# Patient Record
Sex: Male | Born: 1955 | Race: White | Hispanic: No | Marital: Married | Smoking: Never smoker
Health system: Southern US, Community
[De-identification: ages and names within clinical notes are randomized; demographics above are authoritative.]

## PROBLEM LIST (undated history)

## (undated) DIAGNOSIS — K219 Gastro-esophageal reflux disease without esophagitis: Secondary | ICD-10-CM

## (undated) DIAGNOSIS — M1990 Unspecified osteoarthritis, unspecified site: Secondary | ICD-10-CM

## (undated) DIAGNOSIS — C4491 Basal cell carcinoma of skin, unspecified: Secondary | ICD-10-CM

## (undated) DIAGNOSIS — J189 Pneumonia, unspecified organism: Secondary | ICD-10-CM

## (undated) DIAGNOSIS — C801 Malignant (primary) neoplasm, unspecified: Secondary | ICD-10-CM

## (undated) HISTORY — DX: Basal cell carcinoma of skin, unspecified: C44.91

## (undated) HISTORY — PX: COLONOSCOPY: SHX174

## (undated) HISTORY — PX: INGUINAL HERNIA REPAIR: SUR1180

## (undated) HISTORY — PX: HERNIA REPAIR: SHX51

---

## 2010-09-15 LAB — HM COLONOSCOPY

## 2014-11-10 ENCOUNTER — Telehealth: Payer: Self-pay | Admitting: General Practice

## 2014-11-10 NOTE — Telephone Encounter (Signed)
Patient would like to establish care with you.  His wife Timothy Hudson is a patient with Dr. Doug Sou. Please advise

## 2014-11-11 NOTE — Telephone Encounter (Signed)
appt made

## 2014-11-11 NOTE — Telephone Encounter (Signed)
yes

## 2014-11-22 ENCOUNTER — Encounter (HOSPITAL_COMMUNITY): Payer: Self-pay | Admitting: Emergency Medicine

## 2014-11-22 ENCOUNTER — Emergency Department (INDEPENDENT_AMBULATORY_CARE_PROVIDER_SITE_OTHER)
Admission: EM | Admit: 2014-11-22 | Discharge: 2014-11-22 | Disposition: A | Discharge status: Home / Self Care | Payer: PRIVATE HEALTH INSURANCE | Source: Home / Self Care

## 2014-11-22 DIAGNOSIS — L209 Atopic dermatitis, unspecified: Secondary | ICD-10-CM

## 2014-11-22 MED ORDER — METHYLPREDNISOLONE SODIUM SUCC 125 MG IJ SOLR
INTRAMUSCULAR | Status: AC
  Filled 2014-11-22: qty 2

## 2014-11-22 MED ORDER — METHYLPREDNISOLONE SODIUM SUCC 125 MG IJ SOLR
125.0000 mg | Freq: Once | INTRAMUSCULAR | Status: AC
  Administered 2014-11-22: 125 mg via INTRAMUSCULAR

## 2014-11-22 NOTE — ED Notes (Signed)
C/o rash all over body (below neck) onset 7 days Rash becomes blisters Denies fevers, chills Alert... No acute distress.

## 2014-11-22 NOTE — Discharge Instructions (Signed)

## 2014-11-22 NOTE — ED Provider Notes (Signed)
CSN: [id number]     Arrival date & time 11/22/14  1704 History   None    Chief Complaint  Patient presents with  . Rash   (Consider location/radiation/quality/duration/timing/severity/associated sxs/prior Treatment) Patient is a 59 y.o. male presenting with rash. The history is provided by the patient.  Rash Location:  Leg Leg rash location:  L lower leg and R lower leg Quality: blistering, itchiness and redness   Severity:  Moderate Onset quality:  Sudden Duration:  1 week Timing:  Constant Progression:  Spreading Chronicity:  New Context: plant contact   Relieved by:  Anti-itch cream Ineffective treatments:  None tried   History reviewed. No pertinent past medical history. History reviewed. No pertinent past surgical history. No family history on file. Social History  Substance Use Topics  . Smoking status: Never Smoker   . Smokeless tobacco: None  . Alcohol Use: No    Review of Systems  Constitutional: Negative.   HENT: Negative.   Eyes: Negative.   Respiratory: Negative.   Cardiovascular: Negative.   Gastrointestinal: Negative.   Endocrine: Negative.   Genitourinary: Negative.   Skin: Positive for rash.    Allergies  Review of patient's allergies indicates no known allergies.  Home Medications   Prior to Admission medications   Not on File   Meds Ordered and Administered this Visit  Medications - No data to display  BP 121/74 mmHg  Pulse 57  Temp(Src) 97.8 F (36.6 C) (Oral)  Resp 14  SpO2 97% No data found.   Physical Exam  Constitutional: He appears well-developed and well-nourished.  HENT:  Head: Normocephalic and atraumatic.  Eyes: Conjunctivae and EOM are normal. Pupils are equal, round, and reactive to light.  Neck: Normal range of motion.  Cardiovascular: Normal rate, regular rhythm and normal heart sounds.   Pulmonary/Chest: Effort normal and breath sounds normal.  Abdominal: Soft. Bowel sounds are normal.  Skin: Rash noted. Rash  is maculopapular.     Erythematous macular papular rash on bilateral legs.    ED Course  Procedures (including critical care time)  Labs Review Labs Reviewed - No data to display          MDM  Advised patient to take benadryl otc as directed.  Recommend follow up with PCP or  With Dermatology PRN.  He may follow up here prn.    Lysbeth Penner, FNP 11/22/14 [place], [place] 11/22/14 1758

## 2014-11-23 ENCOUNTER — Ambulatory Visit: Payer: PRIVATE HEALTH INSURANCE | Admitting: Internal Medicine

## 2014-11-25 ENCOUNTER — Other Ambulatory Visit (INDEPENDENT_AMBULATORY_CARE_PROVIDER_SITE_OTHER): Payer: PRIVATE HEALTH INSURANCE

## 2014-11-25 ENCOUNTER — Encounter: Payer: Self-pay | Admitting: Internal Medicine

## 2014-11-25 ENCOUNTER — Ambulatory Visit (INDEPENDENT_AMBULATORY_CARE_PROVIDER_SITE_OTHER): Payer: PRIVATE HEALTH INSURANCE | Admitting: Internal Medicine

## 2014-11-25 VITALS — BP 120/86 | HR 60 | Temp 97.8°F | Resp 16 | Ht 66.0 in | Wt 152.0 lb

## 2014-11-25 DIAGNOSIS — Z0000 Encounter for general adult medical examination without abnormal findings: Secondary | ICD-10-CM | POA: Insufficient documentation

## 2014-11-25 DIAGNOSIS — Z0001 Encounter for general adult medical examination with abnormal findings: Secondary | ICD-10-CM | POA: Insufficient documentation

## 2014-11-25 DIAGNOSIS — Z86010 Personal history of colonic polyps: Secondary | ICD-10-CM | POA: Insufficient documentation

## 2014-11-25 LAB — CBC WITH DIFFERENTIAL/PLATELET
Basophils Absolute: 0 10*3/uL (ref 0.0–0.1)
Basophils Relative: 0.1 % (ref 0.0–3.0)
EOS ABS: 0 10*3/uL (ref 0.0–0.7)
Eosinophils Relative: 0 % (ref 0.0–5.0)
HEMATOCRIT: 44.3 % (ref 39.0–52.0)
HEMOGLOBIN: 15.2 g/dL (ref 13.0–17.0)
LYMPHS PCT: 10 % — AB (ref 12.0–46.0)
Lymphs Abs: 0.7 10*3/uL (ref 0.7–4.0)
MCHC: 34.3 g/dL (ref 30.0–36.0)
MCV: 97.7 fl (ref 78.0–100.0)
MONO ABS: 0.2 10*3/uL (ref 0.1–1.0)
Monocytes Relative: 2.2 % — ABNORMAL LOW (ref 3.0–12.0)
Neutro Abs: 6.6 10*3/uL (ref 1.4–7.7)
Neutrophils Relative %: 87.7 % — ABNORMAL HIGH (ref 43.0–77.0)
Platelets: 278 10*3/uL (ref 150.0–400.0)
RBC: 4.54 Mil/uL (ref 4.22–5.81)
RDW: 13.4 % (ref 11.5–15.5)
WBC: 7.5 10*3/uL (ref 4.0–10.5)

## 2014-11-25 LAB — COMPREHENSIVE METABOLIC PANEL
ALBUMIN: 4.3 g/dL (ref 3.5–5.2)
ALK PHOS: 46 U/L (ref 39–117)
ALT: 15 U/L (ref 0–53)
AST: 17 U/L (ref 0–37)
BUN: 22 mg/dL (ref 6–23)
CALCIUM: 9.3 mg/dL (ref 8.4–10.5)
CHLORIDE: 101 meq/L (ref 96–112)
CO2: 30 mEq/L (ref 19–32)
CREATININE: 1.08 mg/dL (ref 0.40–1.50)
GFR: 74.18 mL/min (ref >60.00)
Glucose, Bld: 105 mg/dL — ABNORMAL HIGH (ref 70–99)
POTASSIUM: 4.6 meq/L (ref 3.5–5.1)
SODIUM: 137 meq/L (ref 135–145)
TOTAL PROTEIN: 6.9 g/dL (ref 6.0–8.3)
Total Bilirubin: 0.5 mg/dL (ref 0.2–1.2)

## 2014-11-25 LAB — LIPID PANEL
CHOLESTEROL: 199 mg/dL (ref 0–200)
HDL: 58.7 mg/dL (ref >39.00)
LDL CALC: 116 mg/dL — AB (ref 0–99)
NonHDL: 140.46
TRIGLYCERIDES: 122 mg/dL (ref 0.0–149.0)
Total CHOL/HDL Ratio: 3
VLDL: 24.4 mg/dL (ref 0.0–40.0)

## 2014-11-25 LAB — FECAL OCCULT BLOOD, GUAIAC: FECAL OCCULT BLD: NEGATIVE

## 2014-11-25 LAB — PSA: PSA: 0.42 ng/mL (ref 0.10–4.00)

## 2014-11-25 LAB — TSH: TSH: 0.37 u[IU]/mL (ref 0.35–4.50)

## 2014-11-25 NOTE — Patient Instructions (Signed)

## 2014-11-25 NOTE — Progress Notes (Signed)
Pre visit review using our clinic review tool, if applicable. No additional management support is needed unless otherwise documented below in the visit note. 

## 2014-11-26 ENCOUNTER — Encounter: Payer: Self-pay | Admitting: Internal Medicine

## 2014-11-26 LAB — HEPATITIS C ANTIBODY: HCV AB: NEGATIVE

## 2014-11-26 NOTE — Progress Notes (Signed)
Subjective:  Patient ID: Timothy Hudson, male    DOB: 1955-07-29  Age: 59 y.o. MRN: [id number]  CC: Annual Exam   HPI Timothy Hudson presents for a complete physical. He is new to me. He offers no complaints today. He sees a Paediatric nurse in [place] regularly. He needs a follow-up colonoscopy regarding a history of polyps.  History Timothy Hudson has no past medical history on file.   He has past surgical history that includes Hernia repair [phone]).   His family history includes Alcohol abuse in his father and mother; COPD in his mother; Cancer in his maternal grandmother; Early death (age of onset: 3) in his brother; Heart disease in his father. There is no history of Stroke, Asthma, Diabetes, Drug abuse, Hyperlipidemia, Hypertension, or Kidney disease.He reports that he has never smoked. He has never used smokeless tobacco. He reports that he does not drink alcohol or use illicit drugs.  No outpatient prescriptions prior to visit.   No facility-administered medications prior to visit.    ROS Review of Systems  Constitutional: Negative.   HENT: Negative.   Eyes: Negative.   Respiratory: Negative.  Negative for cough, choking, chest tightness, shortness of breath and stridor.   Cardiovascular: Negative.  Negative for chest pain, palpitations and leg swelling.  Gastrointestinal: Negative.  Negative for nausea, vomiting, abdominal pain, diarrhea, constipation and blood in stool.  Endocrine: Negative.   Genitourinary: Negative.  Negative for difficulty urinating.  Musculoskeletal: Negative.   Skin: Negative.  Negative for rash.  Allergic/Immunologic: Negative.   Neurological: Negative.  Negative for dizziness, tremors and light-headedness.  Hematological: Negative.   Psychiatric/Behavioral: Negative.     Objective:  BP 120/86 mmHg  Pulse 60  Temp(Src) 97.8 F (36.6 C) (Oral)  Resp 16  Ht 5\' 6"  (1.676 m)  Wt 152 lb (68.947 kg)  BMI 24.55 kg/m2  SpO2 97%  Physical Exam    Constitutional: He appears well-developed and well-nourished. No distress.  HENT:  Head: Normocephalic and atraumatic.  Mouth/Throat: Oropharynx is clear and moist. No oropharyngeal exudate.  Eyes: Conjunctivae are normal. Right eye exhibits no discharge. Left eye exhibits no discharge. No scleral icterus.  Neck: Normal range of motion. Neck supple. No JVD present. No tracheal deviation present. No thyromegaly present.  Cardiovascular: Normal rate, regular rhythm, normal heart sounds and intact distal pulses.  Exam reveals no gallop and no friction rub.   No murmur heard. Pulmonary/Chest: Effort normal and breath sounds normal. No stridor. No respiratory distress. He has no wheezes. He has no rales. He exhibits no tenderness.  Abdominal: Soft. Bowel sounds are normal. He exhibits no distension and no mass. There is no tenderness. There is no rebound and no guarding. Hernia confirmed negative in the right inguinal area and confirmed negative in the left inguinal area.  Genitourinary: Rectum normal, prostate normal, testes normal and penis normal. Rectal exam shows no external hemorrhoid, no internal hemorrhoid, no fissure, no mass, no tenderness and anal tone normal. Guaiac negative stool. Prostate is not enlarged and not tender. Right testis shows no mass, no swelling and no tenderness. Right testis is descended. Left testis shows no mass, no swelling and no tenderness. Left testis is descended. Circumcised. No penile erythema or penile tenderness. No discharge found.  Lymphadenopathy:    He has no cervical adenopathy.       Right: No inguinal adenopathy present.       Left: No inguinal adenopathy present.  Skin: He is not diaphoretic.  Vitals reviewed.  Assessment & Plan:   Timothy Hudson was seen today for annual exam.  Diagnoses and all orders for this visit:  Routine general medical examination at a health care facility- his physical exam is been completed and is unremarkable. His labs  show a slight increase in his LDL and blood sugar but these do not require intervention at this time. His vaccines were reviewed and updated. He was referred for colonoscopy. He was given patient education material. -     Lipid panel; Future -     Comprehensive metabolic panel; Future -     CBC with Differential/Platelet; Future -     PSA; Future -     TSH; Future -     Hepatitis C antibody; Future  History of colonic polyps -     Ambulatory referral to Gastroenterology   I am having Timothy Hudson maintain his famotidine.  Meds ordered this encounter  Medications  . famotidine (PEPCID) 20 MG tablet    Sig: Take 20 mg by mouth 2 (two) times daily.     Follow-up: Return in about 1 year (around 11/25/2015).  Scarlette Calico, MD

## 2015-02-05 ENCOUNTER — Telehealth: Payer: Self-pay | Admitting: Internal Medicine

## 2015-02-05 NOTE — Telephone Encounter (Signed)
Rec'd from [facility] forward 20 pages to Dr. Ronnald Ramp

## 2015-04-15 ENCOUNTER — Telehealth: Payer: Self-pay | Admitting: Internal Medicine

## 2015-04-15 ENCOUNTER — Telehealth: Payer: Self-pay

## 2015-04-15 NOTE — Telephone Encounter (Signed)
Rec'd from [facility] forward 20 pages to GI Historical Provider

## 2015-04-15 NOTE — Telephone Encounter (Signed)
Received records from [facility] and placed on Dr. Blanch Media desk for review. Dr. Henrene Pastor is Doc of the Day.

## 2015-04-30 ENCOUNTER — Encounter: Payer: Self-pay | Admitting: Internal Medicine

## 2015-06-21 ENCOUNTER — Ambulatory Visit (AMBULATORY_SURGERY_CENTER): Payer: Self-pay | Admitting: *Deleted

## 2015-06-21 VITALS — Ht 65.5 in | Wt 155.0 lb

## 2015-06-21 DIAGNOSIS — Z86010 Personal history of colonic polyps: Secondary | ICD-10-CM

## 2015-06-21 MED ORDER — NA SULFATE-K SULFATE-MG SULF 17.5-3.13-1.6 GM/177ML PO SOLN: ORAL | Status: DC

## 2015-06-21 NOTE — Progress Notes (Signed)
Patient denies any allergies to eggs or soy. Patient denies any problems with anesthesia/sedation. Patient cannot take Versed, makes him hyper. Patient denies any oxygen use at home and does not take any diet/weight loss medications. Patient declined EMMI education.

## 2015-07-05 ENCOUNTER — Ambulatory Visit (AMBULATORY_SURGERY_CENTER): Payer: PRIVATE HEALTH INSURANCE | Admitting: Internal Medicine

## 2015-07-05 ENCOUNTER — Encounter: Payer: Self-pay | Admitting: Internal Medicine

## 2015-07-05 VITALS — BP 107/68 | HR 56 | Temp 97.3°F | Resp 13 | Ht 65.0 in | Wt 155.0 lb

## 2015-07-05 DIAGNOSIS — D125 Benign neoplasm of sigmoid colon: Secondary | ICD-10-CM

## 2015-07-05 DIAGNOSIS — Z86010 Personal history of colonic polyps: Secondary | ICD-10-CM | POA: Diagnosis present

## 2015-07-05 LAB — HM COLONOSCOPY

## 2015-07-05 MED ORDER — SODIUM CHLORIDE 0.9 % IV SOLN: 500.0000 mL | INTRAVENOUS | Status: DC

## 2015-07-05 NOTE — Progress Notes (Signed)
To recovery, report to [place], RN, VSS 

## 2015-07-05 NOTE — Progress Notes (Signed)
Called to room to assist during endoscopic procedure.  Patient ID and intended procedure confirmed with present staff. Received instructions for my participation in the procedure from the performing physician.  

## 2015-07-05 NOTE — Patient Instructions (Signed)

## 2015-07-05 NOTE — Op Note (Signed)
[facility] Patient Name: Timothy Hudson Procedure Date: 07/05/2015 8:32 AM MRN: [id number] Endoscopist: Docia Chuck. Henrene Pastor , MD Age: 60 Date of Birth: Feb 04, 1956 Gender: Male Procedure:                Colonoscopy with cold snare polypectomy -1 Indications:              High risk colon cancer surveillance: Personal                            history of colonic polyps. Previous examinations                            elsewhere 2007 (tubular adenomas) and 2011                            (diverticulosis) Medicines:                Monitored Anesthesia Care Procedure:                Pre-Anesthesia Assessment:                           - Prior to the procedure, a History and Physical                            was performed, and patient medications and                            allergies were reviewed. The patient's tolerance of                            previous anesthesia was also reviewed. The risks                            and benefits of the procedure and the sedation                            options and risks were discussed with the patient.                            All questions were answered, and informed consent                            was obtained. Prior Anticoagulants: The patient has                            taken no previous anticoagulant or antiplatelet                            agents. ASA Grade Assessment: II - A patient with                            mild systemic disease. After reviewing the risks  and benefits, the patient was deemed in                            satisfactory condition to undergo the procedure.                           After obtaining informed consent, the colonoscope                            was passed under direct vision. Throughout the                            procedure, the patient's blood pressure, pulse, and                            oxygen saturations were monitored continuously. The                Model CF-HQ190L [id number]) scope was introduced                            through the anus and advanced to the the cecum,                            identified by appendiceal orifice and ileocecal                            valve. The colonoscopy was performed without                            difficulty. The patient tolerated the procedure                            well. The quality of the bowel preparation was                            excellent. The bowel preparation used was SUPREP.                            The ileocecal valve, appendiceal orifice, and                            rectum were photographed. Scope In: 8:45:01 AM Scope Out: 8:56:15 AM Scope Withdrawal Time: 0 hours 8 minutes 26 seconds  Total Procedure Duration: 0 hours 11 minutes 14 seconds  Findings:                 The digital rectal exam was normal.                           A 5 mm polyp was found in the sigmoid colon. The                            polyp was semi-pedunculated. The polyp was removed  with a cold snare. Resection and retrieval were                            complete.                           Multiple diverticula were found in the left colon.                           Internal hemorrhoids were found during retroflexion.                           The exam was otherwise without abnormality on                            direct and retroflexion views. Complications:            No immediate complications. Estimated Blood Loss:     Estimated blood loss: none. Impression:               - One 5 mm polyp in the sigmoid colon, removed with                            a cold snare. Resected and retrieved.                           - Diverticulosis in the left colon.                           - Internal hemorrhoids.                           - The examination was otherwise normal on direct                            and retroflexion views. Recommendation:           -  Patient has a contact number available for                            emergencies. The signs and symptoms of potential                            delayed complications were discussed with the                            patient. Return to normal activities tomorrow.                            Written discharge instructions were provided to the                            patient.                           - Resume previous diet.                           -  Continue present medications.                           - Await pathology results.                           - Repeat colonoscopy in 5 years for surveillance. Docia Chuck. Henrene Pastor, MD 07/05/2015 9:03:08 AM This report has been signed electronically. CC Letter to:             Janith Lima, MD

## 2015-07-05 NOTE — Addendum Note (Signed)
Addended by: Janith Lima on: 07/05/2015 09:45 AM   Modules accepted: Miquel Dunn

## 2015-07-06 ENCOUNTER — Telehealth: Payer: Self-pay | Admitting: *Deleted

## 2015-07-06 NOTE — Telephone Encounter (Signed)
  Follow up Call-  Call back number 07/05/2015  Post procedure Call Back phone  # [phone]  Permission to leave phone message Yes     Patient questions:  Do you have a fever, pain , or abdominal swelling? No. Pain Score  0 *  Have you tolerated food without any problems? Yes.    Have you been able to return to your normal activities? Yes.    Do you have any questions about your discharge instructions: Diet   No. Medications  No. Follow up visit  No.  Do you have questions or concerns about your Care? No.  Actions: * If pain score is 4 or above: No action needed, pain <4.

## 2015-07-13 ENCOUNTER — Encounter: Payer: Self-pay | Admitting: Internal Medicine

## 2015-07-14 ENCOUNTER — Encounter: Payer: Self-pay | Admitting: Internal Medicine

## 2015-07-14 LAB — HM COLONOSCOPY

## 2015-07-14 NOTE — Addendum Note (Signed)
Addended by: Janith Lima on: 07/14/2015 07:37 AM   Modules accepted: Miquel Dunn

## 2015-08-03 NOTE — Telephone Encounter (Signed)
Patient has been seen by the doctor

## 2017-02-27 ENCOUNTER — Other Ambulatory Visit (INDEPENDENT_AMBULATORY_CARE_PROVIDER_SITE_OTHER): Payer: PRIVATE HEALTH INSURANCE

## 2017-02-27 ENCOUNTER — Encounter: Payer: Self-pay | Admitting: Internal Medicine

## 2017-02-27 ENCOUNTER — Ambulatory Visit (INDEPENDENT_AMBULATORY_CARE_PROVIDER_SITE_OTHER): Payer: PRIVATE HEALTH INSURANCE | Admitting: Internal Medicine

## 2017-02-27 VITALS — BP 112/70 | HR 60 | Temp 97.9°F | Resp 16 | Ht 65.0 in | Wt 154.2 lb

## 2017-02-27 DIAGNOSIS — Z23 Encounter for immunization: Secondary | ICD-10-CM | POA: Insufficient documentation

## 2017-02-27 DIAGNOSIS — Z0000 Encounter for general adult medical examination without abnormal findings: Secondary | ICD-10-CM | POA: Diagnosis not present

## 2017-02-27 DIAGNOSIS — R202 Paresthesia of skin: Secondary | ICD-10-CM

## 2017-02-27 DIAGNOSIS — G5601 Carpal tunnel syndrome, right upper limb: Secondary | ICD-10-CM | POA: Insufficient documentation

## 2017-02-27 DIAGNOSIS — R200 Anesthesia of skin: Secondary | ICD-10-CM

## 2017-02-27 LAB — COMPREHENSIVE METABOLIC PANEL
ALK PHOS: 41 U/L (ref 39–117)
ALT: 15 U/L (ref 0–53)
AST: 17 U/L (ref 0–37)
Albumin: 4.5 g/dL (ref 3.5–5.2)
BUN: 15 mg/dL (ref 6–23)
CALCIUM: 9.3 mg/dL (ref 8.4–10.5)
CO2: 30 mEq/L (ref 19–32)
Chloride: 102 mEq/L (ref 96–112)
Creatinine, Ser: 1.03 mg/dL (ref 0.40–1.50)
GFR: 77.77 mL/min (ref >60.00)
GLUCOSE: 87 mg/dL (ref 70–99)
POTASSIUM: 4.7 meq/L (ref 3.5–5.1)
Sodium: 138 mEq/L (ref 135–145)
TOTAL PROTEIN: 6.7 g/dL (ref 6.0–8.3)
Total Bilirubin: 0.9 mg/dL (ref 0.2–1.2)

## 2017-02-27 LAB — CBC WITH DIFFERENTIAL/PLATELET
Basophils Absolute: 0.1 10*3/uL (ref 0.0–0.1)
Basophils Relative: 1 % (ref 0.0–3.0)
EOS PCT: 1.9 % (ref 0.0–5.0)
Eosinophils Absolute: 0.1 10*3/uL (ref 0.0–0.7)
HEMATOCRIT: 45 % (ref 39.0–52.0)
Hemoglobin: 15.3 g/dL (ref 13.0–17.0)
LYMPHS ABS: 1.5 10*3/uL (ref 0.7–4.0)
Lymphocytes Relative: 24.9 % (ref 12.0–46.0)
MCHC: 34.1 g/dL (ref 30.0–36.0)
MCV: 97.3 fl (ref 78.0–100.0)
MONOS PCT: 8.2 % (ref 3.0–12.0)
Monocytes Absolute: 0.5 10*3/uL (ref 0.1–1.0)
NEUTROS ABS: 4 10*3/uL (ref 1.4–7.7)
NEUTROS PCT: 64 % (ref 43.0–77.0)
Platelets: 265 10*3/uL (ref 150.0–400.0)
RBC: 4.62 Mil/uL (ref 4.22–5.81)
RDW: 12.3 % (ref 11.5–15.5)
WBC: 6.2 10*3/uL (ref 4.0–10.5)

## 2017-02-27 LAB — LIPID PANEL
Cholesterol: 173 mg/dL (ref 0–200)
HDL: 54.8 mg/dL (ref >39.00)
LDL Cholesterol: 105 mg/dL — ABNORMAL HIGH (ref 0–99)
NONHDL: 118.51
TRIGLYCERIDES: 67 mg/dL (ref 0.0–149.0)
Total CHOL/HDL Ratio: 3
VLDL: 13.4 mg/dL (ref 0.0–40.0)

## 2017-02-27 LAB — PSA: PSA: 0.63 ng/mL (ref 0.10–4.00)

## 2017-02-27 MED ORDER — ZOSTER VAC RECOMB ADJUVANTED 50 MCG/0.5ML IM SUSR: 0.5000 mL | Freq: Once | INTRAMUSCULAR | 1 refills | Status: AC

## 2017-02-27 NOTE — Progress Notes (Signed)
Subjective:  Patient ID: Timothy Hudson, male    DOB: May 27, 1955  Age: 61 y.o. MRN: [id number]  CC: Annual Exam   HPI Timothy Hudson presents for a CPX.  He complains of a 81-month history of intermittent numbness in his right upper extremity.  He has also had a few episodes of neck soreness.  He denies any history of trauma or injury to his neck or upper extremity.  He notices the numbness at night while he sleeping more so than during the day.  He does not experience numbness or tingling anywhere else in his body.  He is very active and has had no recent episodes of DOE, CP, S OB, palpitations, edema, or fatigue.  Outpatient Medications Prior to Visit  Medication Sig Dispense Refill  . famotidine (PEPCID) 20 MG tablet Take 20 mg by mouth 2 (two) times daily.    . Multiple Vitamins-Minerals (CENTRUM PO) Take 1 tablet by mouth daily.     No facility-administered medications prior to visit.     ROS Review of Systems  Constitutional: Negative.  Negative for appetite change, diaphoresis, fatigue and unexpected weight change.  HENT: Negative.   Eyes: Negative.  Negative for visual disturbance.  Respiratory: Negative.  Negative for cough and chest tightness.   Cardiovascular: Negative.  Negative for chest pain, palpitations and leg swelling.  Gastrointestinal: Negative.  Negative for constipation, diarrhea, nausea and vomiting.  Endocrine: Negative.   Genitourinary: Negative for difficulty urinating, dysuria, frequency, hematuria, scrotal swelling, testicular pain and urgency.  Musculoskeletal: Positive for neck pain. Negative for back pain and myalgias.  Skin: Negative.  Negative for rash.  Allergic/Immunologic: Negative.   Neurological: Positive for numbness. Negative for dizziness, weakness, light-headedness and headaches.  Hematological: Negative for adenopathy. Does not bruise/bleed easily.  Psychiatric/Behavioral: Negative.     Objective:  BP 112/70 (BP Location: Left Arm,  Patient Position: Sitting, Cuff Size: Normal)   Pulse 60   Temp 97.9 F (36.6 C) (Oral)   Resp 16   Ht 5\' 5"  (1.651 m)   Wt 154 lb 4 oz (70 kg)   SpO2 98%   BMI 25.67 kg/m   BP Readings from Last 3 Encounters:  02/27/17 112/70  07/05/15 107/68  11/25/14 120/86    Wt Readings from Last 3 Encounters:  02/27/17 154 lb 4 oz (70 kg)  07/05/15 155 lb (70.3 kg)  06/21/15 155 lb (70.3 kg)    Physical Exam  Constitutional: He is oriented to person, place, and time. No distress.  HENT:  Mouth/Throat: Oropharynx is clear and moist. No oropharyngeal exudate.  Eyes: Conjunctivae are normal. Right eye exhibits no discharge. Left eye exhibits no discharge. No scleral icterus.  Neck: Normal range of motion. Neck supple. No JVD present. No thyromegaly present.  Cardiovascular: Normal rate and regular rhythm. Exam reveals no gallop.  No murmur heard. Pulmonary/Chest: Effort normal and breath sounds normal. No respiratory distress. He has no wheezes. He has no rales.  Abdominal: Soft. Bowel sounds are normal. He exhibits no distension and no mass. There is no rebound and no guarding.  Musculoskeletal: Normal range of motion. He exhibits no edema or tenderness.       Cervical back: Normal. He exhibits normal range of motion, no tenderness, no bony tenderness, no swelling, no edema and no deformity.  Lymphadenopathy:    He has no cervical adenopathy.  Neurological: He is alert and oriented to person, place, and time. He has normal strength and normal reflexes. He displays no atrophy,  no tremor and normal reflexes. No cranial nerve deficit or sensory deficit. He exhibits normal muscle tone. He displays a negative Romberg sign. He displays no seizure activity. Coordination and gait normal.  Skin: Skin is warm and dry. No rash noted. He is not diaphoretic. No erythema. No pallor.  Psychiatric: He has a normal mood and affect. His behavior is normal. Judgment and thought content normal.  Vitals  reviewed.   Lab Results  Component Value Date   WBC 6.2 02/27/2017   HGB 15.3 02/27/2017   HCT 45.0 02/27/2017   PLT 265.0 02/27/2017   GLUCOSE 87 02/27/2017   CHOL 173 02/27/2017   TRIG 67.0 02/27/2017   HDL 54.80 02/27/2017   LDLCALC 105 (H) 02/27/2017   ALT 15 02/27/2017   AST 17 02/27/2017   NA 138 02/27/2017   K 4.7 02/27/2017   CL 102 02/27/2017   CREATININE 1.03 02/27/2017   BUN 15 02/27/2017   CO2 30 02/27/2017   TSH 0.37 11/25/2014   PSA 0.63 02/27/2017    No results found.  Assessment & Plan:   Marland was seen today for annual exam.  Diagnoses and all orders for this visit:  Routine general medical examination at a health care facility- Exam completed, labs ordered and reviewed, vaccines reviewed and updated, screening for colon cancer is up-to-date, patient education material was given. -     Comprehensive metabolic panel; Future -     CBC with Differential/Platelet; Future -     Lipid panel; Future -     PSA; Future -     HIV antibody; Future  Need for shingles vaccine -     Zoster Vaccine Adjuvanted [facility]) injection; Inject 0.5 mLs into the muscle once for 1 dose.  Right arm numbness- I have asked him to undergo an NCS/EMG to try to identify the cause of the paresthesias in his right upper extremity. -     Ambulatory referral to Neurology   I am having [name] start on Zoster Vaccine Adjuvanted. I am also having him maintain his famotidine and Multiple Vitamins-Minerals (CENTRUM PO).  Meds ordered this encounter  Medications  . Zoster Vaccine Adjuvanted [facility]) injection    Sig: Inject 0.5 mLs into the muscle once for 1 dose.    Dispense:  0.5 mL    Refill:  1     Follow-up: Return if symptoms worsen or fail to improve.  Scarlette Calico, MD

## 2017-02-27 NOTE — Patient Instructions (Signed)

## 2017-02-28 ENCOUNTER — Encounter: Payer: Self-pay | Admitting: Internal Medicine

## 2017-02-28 LAB — HIV ANTIBODY (ROUTINE TESTING W REFLEX): HIV 1&2 Ab, 4th Generation: NONREACTIVE

## 2017-03-24 ENCOUNTER — Ambulatory Visit: Payer: PRIVATE HEALTH INSURANCE | Admitting: Nurse Practitioner

## 2017-03-24 VITALS — BP 122/74 | HR 69 | Temp 98.0°F | Ht 65.0 in | Wt 157.0 lb

## 2017-03-24 DIAGNOSIS — S40861A Insect bite (nonvenomous) of right upper arm, initial encounter: Secondary | ICD-10-CM | POA: Diagnosis not present

## 2017-03-24 DIAGNOSIS — W57XXXA Bitten or stung by nonvenomous insect and other nonvenomous arthropods, initial encounter: Secondary | ICD-10-CM

## 2017-03-24 MED ORDER — DOXYCYCLINE HYCLATE 100 MG PO TABS: 100.0000 mg | ORAL_TABLET | Freq: Two times a day (BID) | ORAL | 0 refills | Status: DC

## 2017-03-24 NOTE — Patient Instructions (Addendum)
I have sent a prescription for doxycycline 100 mg twice daily for 10 days.  Please follow up for fevers, body aches, a bullseye shaped rash on your body, or if you notice redness spreading from the blister.  For the itching, you may try benadryl or cortisone cream.  It was nice to meet you. Thanks for letting me take care of you today :)   Tick Bite Information, Adult Ticks are insects that can bite. Most ticks live in shrubs and grassy areas. They climb onto people and animals that go by. Then they bite. Some ticks carry germs that can make you sick. How can I prevent tick bites?  Use an insect repellent that has 20% or higher of the ingredients DEET, picaridin, or IR3535. Put this insect repellent on: ? Bare skin. ? The tops of your boots. ? Your pant legs. ? The ends of your sleeves.  If you use an insect repellent that has the ingredient permethrin, make sure to follow the instructions on the bottle. Treat the following: ? Clothing. ? Supplies. ? Boots. ? Tents.  Wear long sleeves, long pants, and light colors.  Tuck your pant legs into your socks.  Stay in the middle of the trail.  Try not to walk through long grass.  Before going inside your house, check your clothes, hair, and skin for ticks. Make sure to check your head, neck, armpits, waist, groin, and joint areas.  Check for ticks every day.  When you come indoors: ? Wash your clothes right away. ? Shower right away. ? Dry your clothes in a dryer on high heat for 60 minutes or more. What is the right way to remove a tick? Remove a tick from your skin as soon as possible.  To remove a tick that is crawling on your skin: ? Go outdoors and brush the tick off. ? Use tape or a lint roller.  To remove a tick that is biting: ? Wash your hands. ? If you have latex gloves, put them on. ? Use tweezers, curved forceps, or a tick-removal tool to grasp the tick. Grasp the tick as close to your skin and as close to the  tick's head as possible. ? Gently pull up until the tick lets go.  Try to keep the tick's head attached to its body.  Do not twist or jerk the tick.  Do not squeeze or crush the tick.  Do not try to remove a tick with heat, alcohol, petroleum jelly, or fingernail polish. How should I get rid of a tick? Here are some ways to get rid of a tick that is alive:  Place the tick in rubbing alcohol.  Place the tick in a bag or container you can close tightly.  Wrap the tick tightly in tape.  Flush the tick down the toilet.  Contact a doctor if:  You have symptoms of a disease, such as: ? Pain in a muscle, joint, or bone. ? Trouble walking or moving your legs. ? Numbness in your legs. ? Inability to move (paralysis). ? A red rash that makes a circle (bull's-eye rash). ? Redness and swelling where the tick bit you. ? A fever. ? Throwing up (vomiting) over and over. ? Diarrhea. ? Weight loss. ? Tender and swollen lymph glands. ? Shortness of breath. ? Cough. ? Belly pain (abdominal pain). ? Headache. ? Being more tired than normal. ? A change in how alert (conscious) you are. ? Confusion. Get help right away if:  You cannot remove a tick.  A part of a tick breaks off and gets stuck in your skin.  You are feeling worse. Summary  Ticks may carry germs that can make you sick.  To prevent tick bites, wear long sleeves, long pants, and light colors. Use insect repellent. Follow the instructions on the bottle.  If the tick is biting, do not try to remove it with heat, alcohol, petroleum jelly, or fingernail polish.  Use tweezers, curved forceps, or a tick-removal tool to grasp the tick. Gently pull up until the tick lets go. Do not twist or jerk the tick. Do not squeeze or crush the tick.  If you have symptoms, contact a doctor. This information is not intended to replace advice given to you by your health care provider. Make sure you discuss any questions you have with  your health care provider. Document Released: 06/07/2009 Document Revised: 06/23/2016 Document Reviewed: 06/23/2016 Elsevier Interactive Patient Education  2018 [employer].

## 2017-03-24 NOTE — Progress Notes (Signed)
Subjective:    Patient ID: Timothy Hudson, male    DOB: 1955/09/25, 61 y.o.   MRN: [id number]  HPI Timothy Hudson is a 61 yo male who presents today for an acute visit. His chief complaint is tick bite.  Tick bite- This is an acute problem. He found the tick on his right upper forearm this past Thursday morning, after deer hunting and cleaning deer on Wednesday. The tick was probably on his arm for about 12 hours, maybe more, he does not recall seeing the tick prior to deer hunting on Wednesday. He was able to pull the tick off with some difficulty, was able to removed the entire tick which he brings with him in a ziploc bag today. He cleaned the site with soap and water and applied polysporin and bandaid after removal. He reports itching, blister to the area now.  He denies pain,  fevers, weakness, malaise, nausea, vomiting, rash.   Review of Systems  See HPI  No past medical history on file.   Social History   Socioeconomic History  . Marital status: Married    Spouse name: Not on file  . Number of children: Not on file  . Years of education: Not on file  . Highest education level: Not on file  Social Needs  . Financial resource strain: Not on file  . Food insecurity - worry: Not on file  . Food insecurity - inability: Not on file  . Transportation needs - medical: Not on file  . Transportation needs - non-medical: Not on file  Occupational History  . Not on file  Tobacco Use  . Smoking status: Never Smoker  . Smokeless tobacco: Never Used  Substance and Sexual Activity  . Alcohol use: No  . Drug use: No  . Sexual activity: Yes    Birth control/protection: None  Other Topics Concern  . Not on file  Social History Narrative  . Not on file    Past Surgical History:  Procedure Laterality Date  . HERNIA REPAIR  1981,2000,2002   right    Family History  Problem Relation Age of Onset  . COPD Mother   . Alcohol abuse Mother   . Heart disease Father   . Alcohol  abuse Father   . Cancer Maternal Grandmother   . Stroke Neg Hx   . Asthma Neg Hx   . Diabetes Neg Hx   . Drug abuse Neg Hx   . Hyperlipidemia Neg Hx   . Hypertension Neg Hx   . Kidney disease Neg Hx   . Colon cancer Neg Hx   . Early death Brother 68       AIDS    Allergies  Allergen Reactions  . Meloxicam Other (See Comments)    Blisters in mouth  . Versed [Midazolam] Anxiety    Current Outpatient Medications on File Prior to Visit  Medication Sig Dispense Refill  . famotidine (PEPCID) 20 MG tablet Take 20 mg by mouth 2 (two) times daily.    . Multiple Vitamins-Minerals (CENTRUM PO) Take 1 tablet by mouth daily.     No current facility-administered medications on file prior to visit.     BP 122/74   Pulse 69   Temp 98 F (36.7 C) (Oral)   Ht 5\' 5"  (1.651 m)   Wt 157 lb (71.2 kg)   SpO2 94%   BMI 26.13 kg/m        Objective:   Physical Exam  Constitutional: He is oriented to  person, place, and time. He appears well-developed and well-nourished. No distress.  HENT:  Head: Normocephalic and atraumatic.  Cardiovascular: Normal rate, regular rhythm, normal heart sounds and intact distal pulses.  Pulmonary/Chest: Effort normal and breath sounds normal.  Lymphadenopathy:    He has no cervical adenopathy.  Neurological: He is alert and oriented to person, place, and time. Coordination normal.  Skin: Skin is warm and dry.     Blister with clear exudate, non-patterned erythema surrounding the area.  Psychiatric: He has a normal mood and affect. Judgment and thought content normal.       Assessment & Plan:  Tick bite, initial encounter Medications ordered: - doxycycline (VIBRA-TABS) 100 MG tablet; Take 1 tablet (100 mg total) by mouth 2 (two) times daily.  Dispense: 20 tablet; Refill: 0 Return precautions given. Pt education handout given.

## 2017-04-05 ENCOUNTER — Encounter (INDEPENDENT_AMBULATORY_CARE_PROVIDER_SITE_OTHER): Payer: PRIVATE HEALTH INSURANCE | Admitting: Diagnostic Neuroimaging

## 2017-04-05 ENCOUNTER — Ambulatory Visit (INDEPENDENT_AMBULATORY_CARE_PROVIDER_SITE_OTHER): Payer: PRIVATE HEALTH INSURANCE | Admitting: Diagnostic Neuroimaging

## 2017-04-05 DIAGNOSIS — R200 Anesthesia of skin: Secondary | ICD-10-CM

## 2017-04-05 DIAGNOSIS — R202 Paresthesia of skin: Secondary | ICD-10-CM | POA: Diagnosis not present

## 2017-04-05 DIAGNOSIS — Z0289 Encounter for other administrative examinations: Secondary | ICD-10-CM

## 2017-04-06 NOTE — Procedures (Signed)
[facility]  NCS (NERVE CONDUCTION STUDY) WITH EMG (ELECTROMYOGRAPHY) REPORT   STUDY DATE: 04/05/17 PATIENT NAME: Timothy Hudson DOB: [birth year] MRN: [id number]  ORDERING CLINICIAN: Scarlette Calico, MD  TECHNOLOGIST: Oneita Jolly ELECTROMYOGRAPHER: Earlean Polka. Penumalli, MD  CLINICAL INFORMATION: 62 year old male with right hand numbness.  Patient denies neck pain.  Evaluate for carpal tunnel syndrome.  FINDINGS: NERVE CONDUCTION STUDY: Right median motor response is prolonged distal latency (7.0 ms) decreased amplitude, normal conduction velocity.  Left median and right ulnar motor responses are normal.  Right ulnar F wave latency is normal.  Right median sensory response could not be obtained.  Left median and bilateral ulnar sensory responses are normal.   NEEDLE ELECTROMYOGRAPHY:  Needle examination of right upper extremity deltoid, biceps, triceps, flexor carpi radialis, first dorsal interosseous is normal.   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     INTERPRETING PHYSICIAN:  Penni Bombard, MD Certified in Neurology, Neurophysiology and Neuroimaging  [facility] [address], [place], [place] [address] [phone]  [facility]    Nerve / Sites Muscle Latency Ref. Amplitude Ref. Rel Amp Segments Distance Velocity Ref. Area    ms ms mV mV %  cm m/s m/s mVms  R Median - APB     Wrist APB 7.0 ?4.4 3.0 ?4.0 100 Wrist - APB 7   10.2     Upper arm APB 11.3  2.9  96 Upper arm - Wrist 21 49 ?49 10.2  L Median - APB     Wrist APB 4.2 ?4.4 7.8 ?4.0 100 Wrist - APB 7   31.0     Upper arm APB 8.1  7.6  97.1 Upper arm - Wrist 21 54 ?49 31.6  R Ulnar - ADM     Wrist ADM 2.7 ?3.3 10.4 ?6.0 100 Wrist - ADM 7   37.8     B.Elbow ADM 5.8  11.1  107 B.Elbow - Wrist 18 59 ?49 43.6     A.Elbow ADM 8.0  11.0  98.6 A.Elbow -  B.Elbow 12 55 ?49 43.4         A.Elbow - Wrist               SNC    Nerve / Sites Rec. Site Peak Lat Ref.  Amp Ref. Segments Distance    ms ms V V  cm  R Median - Orthodromic (Dig II, Mid palm)     Dig II Wrist NR ?3.4 NR ?10 Dig II - Wrist 13  L Median - Orthodromic (Dig II, Mid palm)     Dig II Wrist 3.4 ?3.4 17 ?10 Dig II - Wrist 13  R Ulnar - Orthodromic, (Dig V, Mid palm)     Dig V Wrist 2.6 ?3.1 13 ?5 Dig V - Wrist 11  L Ulnar - Orthodromic, (Dig V, Mid palm)     Dig V Wrist 2.6 ?3.1 19 ?5 Dig V - Wrist 75              F  Wave    Nerve F Lat Ref.   ms ms  R Ulnar - ADM 28.4 ?32.0       EMG full       EMG Summary Table    Spontaneous MUAP Recruitment  Muscle IA Fib PSW Fasc Other Amp Dur. Poly Pattern  R. Deltoid Normal None  None None _______ Normal Normal Normal Normal  R. Biceps brachii Normal None None None _______ Normal Normal Normal Normal  R. Triceps brachii Normal None None None _______ Normal Normal Normal Normal  R. Flexor carpi radialis Normal None None None _______ Normal Normal Normal Normal  R. First dorsal interosseous Normal None None None _______ Normal Normal Normal Normal

## 2017-04-09 ENCOUNTER — Encounter: Payer: Self-pay | Admitting: Internal Medicine

## 2017-04-12 ENCOUNTER — Other Ambulatory Visit: Payer: Self-pay | Admitting: Internal Medicine

## 2017-04-12 DIAGNOSIS — G5601 Carpal tunnel syndrome, right upper limb: Secondary | ICD-10-CM

## 2017-04-24 ENCOUNTER — Ambulatory Visit: Payer: PRIVATE HEALTH INSURANCE | Admitting: Occupational Therapy

## 2017-07-24 ENCOUNTER — Encounter (HOSPITAL_COMMUNITY): Payer: Self-pay

## 2017-07-24 NOTE — Pre-Procedure Instructions (Signed)
Timothy Hudson  07/24/2017     Your procedure is scheduled on Thursday, Aug 02, 2017 at 2:00 PM.   Report to [facility] Entrance "A" Admitting Office at [place].   Call this number if you have problems the morning of surgery: [phone]   Questions prior to day of surgery, please call [phone] between 8 & 4 PM.   Remember:  Do not eat food or drink liquids after midnight Wednesday, 08/01/17.  Take these medicines the morning of surgery with A SIP OF WATER: Famotidine (Pepcid), Tylenol - if needed.  Stop Multivitamins and NSAIDS (Ibuprofen, Aleve, etc) 7 days prior to surgery.  Do not use Aspirin products prior to surgery.   Do not wear jewelry.  Do not wear lotions, powders, cologne or deodorant.  Men may shave face and neck.  Do not bring valuables to the hospital.  [facility] is not responsible for any belongings or valuables.  Contacts, dentures or bridgework may not be worn into surgery.  Leave your suitcase in the car.  After surgery it may be brought to your room.  For patients admitted to the hospital, discharge time will be determined by your treatment team.  Patients discharged the day of surgery will not be allowed to drive home.   [place] - Preparing for Surgery  Before surgery, you can play an important role.  Because skin is not sterile, your skin needs to be as free of germs as possible.  You can reduce the number of germs on you skin by washing with CHG (chlorahexidine gluconate) soap before surgery.  CHG is an antiseptic cleaner which kills germs and bonds with the skin to continue killing germs even after washing.  Please DO NOT use if you have an allergy to CHG or antibacterial soaps.  If your skin becomes reddened/irritated stop using the CHG and inform your nurse when you arrive at Short Stay.  Do not shave (including legs and underarms) for at least 48 hours prior to the first CHG shower.  You may shave your face.  Please follow these  instructions carefully:   1.  Shower with CHG Soap the night before surgery and the                    morning of Surgery.  2.  If you choose to wash your hair, wash your hair first as usual with your       normal shampoo.  3.  After you shampoo, rinse your hair and body thoroughly to remove the shampoo.  4.  Use CHG as you would any other liquid soap.  You can apply chg directly       to the skin and wash gently with scrungie or a clean washcloth.  5.  Apply the CHG Soap to your body ONLY FROM THE NECK DOWN.        Do not use on open wounds or open sores.  Avoid contact with your eyes, ears, mouth and genitals (private parts).  Wash genitals (private parts) with your normal soap.  6.  Wash thoroughly, paying special attention to the area where your surgery        will be performed.  7.  Thoroughly rinse your body with warm water from the neck down.  8.  DO NOT shower/wash with your normal soap after using and rinsing off       the CHG Soap.  9.  Pat yourself dry with a clean  towel.            10.  Wear clean pajamas.            11.  Place clean sheets on your bed the night of your first shower and do not        sleep with pets.  Day of Surgery  Shower as above. Do not apply any lotions/deodorants the morning of surgery.  Please wear clean clothes to the hospital.   Please read over the fact sheets that you were given.

## 2017-07-25 ENCOUNTER — Encounter (HOSPITAL_COMMUNITY)
Admission: RE | Admit: 2017-07-25 | Discharge: 2017-07-25 | Disposition: A | Discharge status: Home / Self Care | Payer: PRIVATE HEALTH INSURANCE | Source: Ambulatory Visit | Attending: Orthopedic Surgery | Admitting: Orthopedic Surgery

## 2017-07-25 ENCOUNTER — Other Ambulatory Visit: Payer: Self-pay

## 2017-07-25 ENCOUNTER — Other Ambulatory Visit (HOSPITAL_COMMUNITY): Payer: Self-pay | Admitting: *Deleted

## 2017-07-25 ENCOUNTER — Encounter (HOSPITAL_COMMUNITY): Payer: Self-pay

## 2017-07-25 DIAGNOSIS — Z01812 Encounter for preprocedural laboratory examination: Secondary | ICD-10-CM | POA: Diagnosis not present

## 2017-07-25 HISTORY — DX: Gastro-esophageal reflux disease without esophagitis: K21.9

## 2017-07-25 HISTORY — DX: Malignant (primary) neoplasm, unspecified: C80.1

## 2017-07-25 HISTORY — DX: Unspecified osteoarthritis, unspecified site: M19.90

## 2017-07-25 HISTORY — DX: Pneumonia, unspecified organism: J18.9

## 2017-07-25 LAB — CBC
HCT: 43.7 % (ref 39.0–52.0)
Hemoglobin: 15.2 g/dL (ref 13.0–17.0)
MCH: 33.3 pg (ref 26.0–34.0)
MCHC: 34.8 g/dL (ref 30.0–36.0)
MCV: 95.8 fL (ref 78.0–100.0)
PLATELETS: 224 10*3/uL (ref 150–400)
RBC: 4.56 MIL/uL (ref 4.22–5.81)
RDW: 12.6 % (ref 11.5–15.5)
WBC: 4.5 10*3/uL (ref 4.0–10.5)

## 2017-07-25 NOTE — Progress Notes (Signed)
Pt denies cardiac history, HTN or diabetes.

## 2017-08-02 ENCOUNTER — Ambulatory Visit (HOSPITAL_COMMUNITY): Payer: PRIVATE HEALTH INSURANCE | Admitting: Certified Registered Nurse Anesthetist

## 2017-08-02 ENCOUNTER — Encounter (HOSPITAL_COMMUNITY): Payer: Self-pay

## 2017-08-02 ENCOUNTER — Ambulatory Visit (HOSPITAL_COMMUNITY)
Admission: RE | Admit: 2017-08-02 | Discharge: 2017-08-02 | Disposition: A | Discharge status: Home / Self Care | Payer: PRIVATE HEALTH INSURANCE | Source: Ambulatory Visit | Attending: Orthopedic Surgery | Admitting: Orthopedic Surgery

## 2017-08-02 ENCOUNTER — Encounter (HOSPITAL_COMMUNITY)
Admission: RE | Disposition: A | Discharge status: Home / Self Care | Payer: Self-pay | Source: Ambulatory Visit | Attending: Orthopedic Surgery

## 2017-08-02 DIAGNOSIS — Z888 Allergy status to other drugs, medicaments and biological substances status: Secondary | ICD-10-CM | POA: Diagnosis not present

## 2017-08-02 DIAGNOSIS — Z85828 Personal history of other malignant neoplasm of skin: Secondary | ICD-10-CM | POA: Diagnosis not present

## 2017-08-02 DIAGNOSIS — Z79899 Other long term (current) drug therapy: Secondary | ICD-10-CM | POA: Insufficient documentation

## 2017-08-02 DIAGNOSIS — G5601 Carpal tunnel syndrome, right upper limb: Secondary | ICD-10-CM | POA: Insufficient documentation

## 2017-08-02 DIAGNOSIS — K219 Gastro-esophageal reflux disease without esophagitis: Secondary | ICD-10-CM | POA: Insufficient documentation

## 2017-08-02 DIAGNOSIS — M1990 Unspecified osteoarthritis, unspecified site: Secondary | ICD-10-CM | POA: Insufficient documentation

## 2017-08-02 HISTORY — PX: CARPAL TUNNEL RELEASE: SHX101

## 2017-08-02 SURGERY — CARPAL TUNNEL RELEASE
Anesthesia: General | Site: Hand | Laterality: Right

## 2017-08-02 MED ORDER — OXYCODONE HCL 5 MG PO TABS: 5.0000 mg | ORAL_TABLET | Freq: Once | ORAL | Status: DC | PRN

## 2017-08-02 MED ORDER — HYDROMORPHONE HCL 2 MG/ML IJ SOLN: 0.2500 mg | INTRAMUSCULAR | Status: DC | PRN

## 2017-08-02 MED ORDER — CEFAZOLIN SODIUM-DEXTROSE 2-4 GM/100ML-% IV SOLN
2.0000 g | Freq: Once | INTRAVENOUS | Status: AC
  Administered 2017-08-02: 2 g via INTRAVENOUS

## 2017-08-02 MED ORDER — MIDAZOLAM HCL 2 MG/2ML IJ SOLN
INTRAMUSCULAR | Status: AC
  Filled 2017-08-02: qty 2

## 2017-08-02 MED ORDER — HYDROCODONE-ACETAMINOPHEN 5-325 MG PO TABS: 2.0000 | ORAL_TABLET | Freq: Four times a day (QID) | ORAL | 0 refills | Status: AC | PRN

## 2017-08-02 MED ORDER — PROMETHAZINE HCL 25 MG/ML IJ SOLN: 6.2500 mg | INTRAMUSCULAR | Status: DC | PRN

## 2017-08-02 MED ORDER — FENTANYL CITRATE (PF) 250 MCG/5ML IJ SOLN
INTRAMUSCULAR | Status: AC
  Filled 2017-08-02: qty 5

## 2017-08-02 MED ORDER — ONDANSETRON HCL 4 MG/2ML IJ SOLN
INTRAMUSCULAR | Status: DC | PRN
Start: 2017-08-02 — End: 2017-08-02
  Administered 2017-08-02: 4 mg via INTRAVENOUS

## 2017-08-02 MED ORDER — OXYCODONE HCL 5 MG/5ML PO SOLN: 5.0000 mg | Freq: Once | ORAL | Status: DC | PRN

## 2017-08-02 MED ORDER — FENTANYL CITRATE (PF) 100 MCG/2ML IJ SOLN
INTRAMUSCULAR | Status: DC | PRN
  Administered 2017-08-02: 50 ug via INTRAVENOUS

## 2017-08-02 MED ORDER — LACTATED RINGERS IV SOLN
INTRAVENOUS | Status: DC
  Administered 2017-08-02: 14:00:00 via INTRAVENOUS

## 2017-08-02 MED ORDER — CEFAZOLIN SODIUM-DEXTROSE 2-4 GM/100ML-% IV SOLN
INTRAVENOUS | Status: AC
  Filled 2017-08-02: qty 100

## 2017-08-02 MED ORDER — ONDANSETRON HCL 4 MG/2ML IJ SOLN
INTRAMUSCULAR | Status: AC
  Filled 2017-08-02: qty 2

## 2017-08-02 MED ORDER — BUPIVACAINE HCL (PF) 0.25 % IJ SOLN
INTRAMUSCULAR | Status: AC
  Filled 2017-08-02: qty 30

## 2017-08-02 MED ORDER — PHENYLEPHRINE 40 MCG/ML (10ML) SYRINGE FOR IV PUSH (FOR BLOOD PRESSURE SUPPORT)
PREFILLED_SYRINGE | INTRAVENOUS | Status: AC
  Filled 2017-08-02: qty 10

## 2017-08-02 MED ORDER — LIDOCAINE HCL (CARDIAC) PF 100 MG/5ML IV SOSY
PREFILLED_SYRINGE | INTRAVENOUS | Status: DC | PRN
  Administered 2017-08-02: 60 mg via INTRAVENOUS

## 2017-08-02 MED ORDER — PROPOFOL 10 MG/ML IV BOLUS
INTRAVENOUS | Status: DC | PRN
  Administered 2017-08-02: 200 mg via INTRAVENOUS

## 2017-08-02 MED ORDER — SODIUM CHLORIDE 0.9 % IJ SOLN
INTRAMUSCULAR | Status: AC
  Filled 2017-08-02: qty 10

## 2017-08-02 MED ORDER — BUPIVACAINE HCL (PF) 0.25 % IJ SOLN
INTRAMUSCULAR | Status: DC | PRN
  Administered 2017-08-02: 10 mL

## 2017-08-02 MED ORDER — PROPOFOL 10 MG/ML IV BOLUS
INTRAVENOUS | Status: AC
  Filled 2017-08-02: qty 20

## 2017-08-02 MED ORDER — 0.9 % SODIUM CHLORIDE (POUR BTL) OPTIME
TOPICAL | Status: DC | PRN
  Administered 2017-08-02: 1000 mL

## 2017-08-02 SURGICAL SUPPLY — 44 items
BANDAGE ACE 3X5.8 VEL STRL LF (GAUZE/BANDAGES/DRESSINGS) ×6 IMPLANT
BANDAGE ACE 4X5 VEL STRL LF (GAUZE/BANDAGES/DRESSINGS) ×3 IMPLANT
BNDG GAUZE ELAST 4 BULKY (GAUZE/BANDAGES/DRESSINGS) ×3 IMPLANT
CORDS BIPOLAR (ELECTRODE) ×3 IMPLANT
COVER SURGICAL LIGHT HANDLE (MISCELLANEOUS) ×3 IMPLANT
CUFF TOURNIQUET SINGLE 18IN (TOURNIQUET CUFF) ×3 IMPLANT
CUFF TOURNIQUET SINGLE 24IN (TOURNIQUET CUFF) IMPLANT
DECANTER SPIKE VIAL GLASS SM (MISCELLANEOUS) ×3 IMPLANT
DRAPE SURG 17X23 STRL (DRAPES) ×3 IMPLANT
EVACUATOR 1/8 PVC DRAIN (DRAIN) IMPLANT
GAUZE SPONGE 4X4 12PLY STRL (GAUZE/BANDAGES/DRESSINGS) ×3 IMPLANT
GAUZE XEROFORM 1X8 LF (GAUZE/BANDAGES/DRESSINGS) ×3 IMPLANT
GLOVE BIOGEL M 8.0 STRL (GLOVE) ×3 IMPLANT
GLOVE SS BIOGEL STRL SZ 8 (GLOVE) ×1 IMPLANT
GLOVE SUPERSENSE BIOGEL SZ 8 (GLOVE) ×2
GOWN STRL REUS W/ TWL LRG LVL3 (GOWN DISPOSABLE) ×2 IMPLANT
GOWN STRL REUS W/ TWL XL LVL3 (GOWN DISPOSABLE) ×3 IMPLANT
GOWN STRL REUS W/TWL LRG LVL3 (GOWN DISPOSABLE) ×4
GOWN STRL REUS W/TWL XL LVL3 (GOWN DISPOSABLE) ×6
KIT BASIN OR (CUSTOM PROCEDURE TRAY) ×3 IMPLANT
KIT TURNOVER KIT B (KITS) ×3 IMPLANT
LOOP VESSEL MAXI BLUE (MISCELLANEOUS) IMPLANT
NEEDLE HYPO 25GX1X1/2 BEV (NEEDLE) IMPLANT
NS IRRIG 1000ML POUR BTL (IV SOLUTION) ×3 IMPLANT
PACK ORTHO EXTREMITY (CUSTOM PROCEDURE TRAY) ×3 IMPLANT
PAD ARMBOARD 7.5X6 YLW CONV (MISCELLANEOUS) ×6 IMPLANT
PAD CAST 4YDX4 CTTN HI CHSV (CAST SUPPLIES) ×1 IMPLANT
PADDING CAST COTTON 4X4 STRL (CAST SUPPLIES) ×2
SCRUB BETADINE 4OZ XXX (MISCELLANEOUS) ×3 IMPLANT
SOL PREP POV-IOD 4OZ 10% (MISCELLANEOUS) ×6 IMPLANT
SPLINT FIBERGLASS 3X12 (CAST SUPPLIES) ×3 IMPLANT
SUT PROLENE 4 0 PS 2 18 (SUTURE) ×3 IMPLANT
SUT VIC AB 2-0 CT1 27 (SUTURE)
SUT VIC AB 2-0 CT1 TAPERPNT 27 (SUTURE) IMPLANT
SUT VIC AB 3-0 FS2 27 (SUTURE) IMPLANT
SYR CONTROL 10ML LL (SYRINGE) IMPLANT
SYSTEM CHEST DRAIN TLS 7FR (DRAIN) IMPLANT
TOWEL OR 17X24 6PK STRL BLUE (TOWEL DISPOSABLE) ×3 IMPLANT
TOWEL OR 17X26 10 PK STRL BLUE (TOWEL DISPOSABLE) ×3 IMPLANT
TUBE CONNECTING 12'X1/4 (SUCTIONS)
TUBE CONNECTING 12X1/4 (SUCTIONS) IMPLANT
TUBE EVACUATION TLS (MISCELLANEOUS) ×3 IMPLANT
UNDERPAD 30X30 (UNDERPADS AND DIAPERS) ×3 IMPLANT
WATER STERILE IRR 1000ML POUR (IV SOLUTION) ×3 IMPLANT

## 2017-08-02 NOTE — Op Note (Signed)
Timothy Hudson, Timothy Hudson MEDICAL RECORD BP:[id number] ACCOUNT [id number] DATE OF BIRTH:1955/08/06 FACILITY: [facility] LOCATION: [facility]-PER[facility] PHYSICIAN:Dakwon Wenberg M. Desare [name], MD  OPERATIVE REPORT  DATE OF PROCEDURE:  08/02/2017  PREOPERATIVE DIAGNOSIS:  Right carpal tunnel syndrome.  POSTOPERATIVE DIAGNOSIS:  Right carpal tunnel syndrome.  PROCEDURE:  Right limited open carpal tunnel release.  SURGEON:  Roseanne Kaufman, MD  ASSISTANT:  None.  COMPLICATIONS:  None.  ANESTHESIA:  General.  TOURNIQUET TIME:  Less than 20 minutes.  INDICATIONS:  A 62 year old male with carpal tunnel syndrome documented objectively.  Please see office notes.  The patient understands risks and benefits of surgery and our goal is to accomplish a tension-free environment with better oxygen and blood  flow to the nerve to allow it to restore itself to quiescence.    DESCRIPTION OF PROCEDURE:  The patient was seen by myself and anesthesia.  We had a lengthy conversation.  He chose a general anesthetic.  He underwent a thorough prep and drape with Hibiclens scrub followed by Betadine scrub and paint, followed by sterile field being secured.  Timeout being called and antibiotics being given IV route.  Operation commenced with incision 1.5 cm at the distal  edge of the transverse carpal ligament.  Dissection was carried down.  Retractor placed.  Distal edges transverse carpal ligament released under 4.0 loupe magnification.  Following this, the fat pad aggression I verified with 4.5 expanded loupe  magnification.  Distal release.  I then dissected in a distal to proximal direction until adequate room was available to release the proximal leaflet and portions of the antebrachial fascia off of the ulnar ledge.  The patient had complete release.   Median nerve was protected and was noted to be hyperemic and had signs of chronic compression.  The patient tolerated this well.  There were no complicating features.  I irrigated  copiously, secured hemostasis and closed the wound with Prolene.  At the  conclusion of the case, dressing and splint were applied.  There were no complicating features.    He will be discharged on Norco, notify me should any problems occur, and see me back in the office in 8-10 days.  These notes have been discussed and all questions have been encouraged and answered.  It was a pleasure to participate in his care.  I look  forward to participating in his postoperative recovery.  This was an uncomplicated carpal tunnel release.  AN/NUANCE  D:08/02/2017 T:08/02/2017 JOB:[id number]

## 2017-08-02 NOTE — Transfer of Care (Signed)
Immediate Anesthesia Transfer of Care Note  Patient: Timothy Hudson  Procedure(s) Performed: RIGHT LIMITED OPEN CARPAL TUNNEL RELEASE (Right Hand)  Patient Location: PACU  Anesthesia Type:General  Level of Consciousness: awake and alert   Airway & Oxygen Therapy: Patient Spontanous Breathing and Patient connected to nasal cannula oxygen  Post-op Assessment: Report given to RN and Post -op Vital signs reviewed and stable  Post vital signs: Reviewed and stable  Last Vitals:  Vitals Value Taken Time  BP 138/88 08/02/2017  2:55 PM  Temp    Pulse 58 08/02/2017  2:56 PM  Resp 9 08/02/2017  2:56 PM  SpO2 92 % 08/02/2017  2:56 PM  Vitals shown include unvalidated device data.  Last Pain:  Vitals:   08/02/17 1212  TempSrc: Oral         Complications: No apparent anesthesia complications

## 2017-08-02 NOTE — H&P (Signed)
Timothy Hudson is an 62 y.o. male.   Chief Complaint: R CTS HPI: Patient presents for evaluation and treatment of the of their upper extremity predicament. The patient denies neck, back, chest or  abdominal pain. The patient notes that they have no lower extremity problems. The patients primary complaint is noted. We are planning surgical care pathway for the upper extremity.  Past Medical History:  Diagnosis Date  . Arthritis   . 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 GERD (gastroesophageal reflux disease)   . Pneumonia     Past Surgical History:  Procedure Laterality Date  . COLONOSCOPY    . HERNIA REPAIR  1981,2000,2002   right    Family History  Problem Relation Age of Onset  . COPD Mother   . Alcohol abuse Mother   . Heart disease Father   . Alcohol abuse Father   . Early death Brother 59       AIDS  . Cancer Maternal Grandmother   . Stroke Neg Hx   . Asthma Neg Hx   . Diabetes Neg Hx   . Drug abuse Neg Hx   . Hyperlipidemia Neg Hx   . Hypertension Neg Hx   . Kidney disease Neg Hx   . Colon cancer Neg Hx    Social History:  reports that he has never smoked. He has never used smokeless tobacco. He reports that he does not drink alcohol or use drugs.  Allergies:  Allergies  Allergen Reactions  . Meloxicam Other (See Comments)    Blisters in mouth. Pt takes ibuprofen at home.  . Midazolam Anxiety and Other (See Comments)  . Prednisone Other (See Comments)    INSOMNIA (HYPERACTIVITY)/MOODS CHANGES    Medications Prior to Admission  Medication Sig Dispense Refill  . acetaminophen (TYLENOL) 500 MG tablet Take 1,000 mg by mouth every 6 (six) hours as needed (for pain/headaches.).    Marland Kitchen famotidine (PEPCID) 20 MG tablet Take 20 mg by mouth 2 (two) times daily.    Marland Kitchen ibuprofen (ADVIL,MOTRIN) 200 MG tablet Take 400-600 mg by mouth every 8 (eight) hours as needed (for pain/headaches.).    Marland Kitchen Multiple Vitamin (MULTIVITAMIN WITH  MINERALS) TABS tablet Take 1 tablet by mouth daily. CENTRUM SILVER 50+    . doxycycline (VIBRA-TABS) 100 MG tablet Take 1 tablet (100 mg total) by mouth 2 (two) times daily. (Patient not taking: Reported on 07/18/2017) 20 tablet 0    No results found for this or any previous visit (from the past 48 hour(s)). No results found.  Review of Systems  Respiratory: Negative.   Cardiovascular: Negative.   Gastrointestinal: Negative.   Genitourinary: Negative.     Blood pressure 138/87, pulse (!) 58, temperature 97.9 F (36.6 C), temperature source Oral, resp. rate 20, height 5\' 7"  (1.702 m), weight 70.9 kg (156 lb 3.2 oz), SpO2 98 %. Physical Exam   R CTS The patient is alert and oriented in no acute distress. The patient complains of pain in the affected upper extremity.  The patient is noted to have a normal HEENT exam. Lung fields show equal chest expansion and no shortness of breath. Abdomen exam is nontender without distention. Lower extremity examination does not show any fracture dislocation or blood clot symptoms. Pelvis is stable and the neck and back are stable and nontender.  Assessment/Plan Plan R CTR We are planning surgery for your upper extremity. The risk and benefits of surgery to include  risk of bleeding, infection, anesthesia,  damage to normal structures and failure of the surgery to accomplish its intended goals of relieving symptoms and restoring function have been discussed in detail. With this in mind we plan to proceed. I have specifically discussed with the patient the pre-and postoperative regime and the dos and don'ts and risk and benefits in great detail. Risk and benefits of surgery also include risk of dystrophy(CRPS), chronic nerve pain, failure of the healing process to go onto completion and other inherent risks of surgery The relavent the pathophysiology of the disease/injury process, as well as the alternatives for treatment and postoperative course of action  has been discussed in great detail with the patient who desires to proceed.  We will do everything in our power to help you (the patient) restore function to the upper extremity. It is a pleasure to see this patient today.   Willa Frater III, MD 08/02/2017, 1:50 PM

## 2017-08-02 NOTE — Anesthesia Postprocedure Evaluation (Signed)
Anesthesia Post Note  Patient: Timothy Hudson  Procedure(s) Performed: RIGHT LIMITED OPEN CARPAL TUNNEL RELEASE (Right Hand)     Patient location during evaluation: PACU Anesthesia Type: General Level of consciousness: awake and alert Pain management: pain level controlled Vital Signs Assessment: post-procedure vital signs reviewed and stable Respiratory status: spontaneous breathing, nonlabored ventilation, respiratory function stable and patient connected to nasal cannula oxygen Cardiovascular status: blood pressure returned to baseline and stable Postop Assessment: no apparent nausea or vomiting Anesthetic complications: no    Last Vitals:  Vitals:   08/02/17 1515 08/02/17 1530  BP: (!) 125/91 136/82  Pulse: (!) 58 (!) 57  Resp: 16 16  Temp:  (!) 36.4 C  SpO2: 100% 100%    Last Pain:  Vitals:   08/02/17 1515  TempSrc:   PainSc: 0-No pain                 Ryan P Ellender

## 2017-08-02 NOTE — Op Note (Signed)
SEE Dict #[id number] SP R CTR Kempton Milne MD

## 2017-08-02 NOTE — Anesthesia Preprocedure Evaluation (Addendum)
Anesthesia Evaluation  Patient identified by MRN, date of birth, ID band Patient awake    Reviewed: Allergy & Precautions, NPO status , Patient's Chart, lab work & pertinent test results  Airway Mallampati: II  TM Distance: >3 FB Neck ROM: Full    Dental  (+)    Pulmonary neg pulmonary ROS,    Pulmonary exam normal breath sounds clear to auscultation       Cardiovascular negative cardio ROS Normal cardiovascular exam Rhythm:Regular Rate:Normal     Neuro/Psych negative neurological ROS  negative psych ROS   GI/Hepatic Neg liver ROS, GERD  Medicated and Controlled,  Endo/Other  negative endocrine ROS  Renal/GU negative Renal ROS     Musculoskeletal negative musculoskeletal ROS (+)   Abdominal   Peds  Hematology negative hematology ROS (+)   Anesthesia Other Findings Right carpal tunnel syndrome  Reproductive/Obstetrics                            Anesthesia Physical Anesthesia Plan  ASA: II  Anesthesia Plan: General   Post-op Pain Management:    Induction: Intravenous  PONV Risk Score and Plan: 2 and Dexamethasone, Ondansetron and Treatment may vary due to age or medical condition  Airway Management Planned: LMA  Additional Equipment:   Intra-op Plan:   Post-operative Plan: Extubation in OR  Informed Consent: I have reviewed the patients History and Physical, chart, labs and discussed the procedure including the risks, benefits and alternatives for the proposed anesthesia with the patient or authorized representative who has indicated his/her understanding and acceptance.   Dental advisory given  Plan Discussed with: CRNA  Anesthesia Plan Comments:         Anesthesia Quick Evaluation

## 2017-08-02 NOTE — Anesthesia Procedure Notes (Signed)
Procedure Name: Intubation Date/Time: 08/02/2017 2:11 PM Performed by: Inda Coke, CRNA Pre-anesthesia Checklist: Patient identified, Emergency Drugs available, Suction available and Patient being monitored Patient Re-evaluated:Patient Re-evaluated prior to induction Oxygen Delivery Method: Circle System Utilized Preoxygenation: Pre-oxygenation with 100% oxygen Induction Type: IV induction Ventilation: Mask ventilation without difficulty LMA: LMA inserted LMA Size: 4.0 Tube type: Oral Number of attempts: 1 Placement Confirmation: positive ETCO2 and breath sounds checked- equal and bilateral Tube secured with: Tape Dental Injury: Teeth and Oropharynx as per pre-operative assessment

## 2017-08-02 NOTE — Discharge Instructions (Signed)
Keep bandage clean and dry.  Call for any problems.  No smoking.  Criteria for driving a car: you should be off your pain medicine for 7-8 hours, able to drive one handed(confident), thinking clearly and feeling able in your judgement to drive. Continue elevation as it will decrease swelling.  If instructed by MD move your fingers within the confines of the bandage/splint.  Use ice if instructed by your MD. Call immediately for any sudden loss of feeling in your hand/arm or change in functional abilities of the extremity.Keep bandage clean and dry.  Call for any problems.  No smoking.  Criteria for driving a car: you should be off your pain medicine for 7-8 hours, able to drive one handed(confident), thinking clearly and feeling able in your judgement to drive. Continue elevation as it will decrease swelling.  If instructed by MD move your fingers within the confines of the bandage/splint.  Use ice if instructed by your MD. Call immediately for any sudden loss of feeling in your hand/arm or change in functional abilities of the extremity.We recommend that you to take vitamin C 1000 mg a day to promote healing. We also recommend that if you require  pain medicine that you take a stool softener to prevent constipation as most pain medicines will have constipation side effects. We recommend either Peri-Colace or Senokot and recommend that you also consider adding MiraLAX as well to prevent the constipation affects from pain medicine if you are required to use them. These medicines are over the counter and may be purchased at a local pharmacy. A cup of yogurt and a probiotic can also be helpful during the recovery process as the medicines can disrupt your intestinal environment.

## 2017-08-03 ENCOUNTER — Encounter (HOSPITAL_COMMUNITY): Payer: Self-pay | Admitting: Orthopedic Surgery

## 2017-08-10 DIAGNOSIS — Z4789 Encounter for other orthopedic aftercare: Secondary | ICD-10-CM | POA: Insufficient documentation

## 2017-12-03 ENCOUNTER — Ambulatory Visit: Payer: 59 | Admitting: Family

## 2017-12-03 ENCOUNTER — Encounter: Payer: Self-pay | Admitting: Family

## 2017-12-03 VITALS — BP 128/70 | HR 64 | Temp 98.2°F | Ht 67.0 in | Wt 159.1 lb

## 2017-12-03 DIAGNOSIS — Z7189 Other specified counseling: Secondary | ICD-10-CM

## 2017-12-03 DIAGNOSIS — Z298 Encounter for other specified prophylactic measures: Secondary | ICD-10-CM | POA: Diagnosis not present

## 2017-12-03 DIAGNOSIS — Z23 Encounter for immunization: Secondary | ICD-10-CM | POA: Diagnosis not present

## 2017-12-03 DIAGNOSIS — Z7184 Encounter for health counseling related to travel: Secondary | ICD-10-CM

## 2017-12-03 MED ORDER — TYPHOID VACCINE PO CPDR: 1.0000 | DELAYED_RELEASE_CAPSULE | ORAL | 0 refills | Status: DC

## 2017-12-03 MED ORDER — CHLOROQUINE PHOSPHATE 500 MG PO TABS: 500.0000 mg | ORAL_TABLET | Freq: Every day | ORAL | 0 refills | Status: DC

## 2017-12-03 NOTE — Progress Notes (Signed)
Alison Breeding is a 62 y.o. male with the following history as recorded in [employer]:  Patient Active Problem List   Diagnosis Date Noted  . Need for shingles vaccine 02/27/2017  . Carpal tunnel syndrome on right 02/27/2017  . Routine general medical examination at a health care facility 11/25/2014  . History of colonic polyps 11/25/2014    Current Outpatient Medications  Medication Sig Dispense Refill  . acetaminophen (TYLENOL) 500 MG tablet Take 1,000 mg by mouth every 6 (six) hours as needed (for pain/headaches.).    Marland Kitchen famotidine (PEPCID) 20 MG tablet Take 20 mg by mouth 2 (two) times daily.    Marland Kitchen ibuprofen (ADVIL,MOTRIN) 200 MG tablet Take 400-600 mg by mouth every 8 (eight) hours as needed (for pain/headaches.).    Marland Kitchen Multiple Vitamin (MULTIVITAMIN WITH MINERALS) TABS tablet Take 1 tablet by mouth daily. CENTRUM SILVER 50+    . chloroquine (ARALEN) 500 MG tablet Take 1 tablet (500 mg total) by mouth daily. Take 1 tablet weekly, begin 1 weeks prior to travel and complete 4 weeks after travel. 6 tablet 0  . typhoid (VIVOTIF) DR capsule Take 1 capsule by mouth every other day. 4 capsule 0   No current facility-administered medications for this visit.     Allergies: Meloxicam; Midazolam; and Prednisone  Past Medical History:  Diagnosis Date  . Arthritis   . 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 GERD (gastroesophageal reflux disease)   . Pneumonia     Past Surgical History:  Procedure Laterality Date  . CARPAL TUNNEL RELEASE Right 08/02/2017   Procedure: RIGHT LIMITED OPEN CARPAL TUNNEL RELEASE;  Surgeon: Roseanne Kaufman, MD;  Location: [facility];  Service: Orthopedics;  Laterality: Right;  60 MINS  . COLONOSCOPY    . HERNIA REPAIR  1981,2000,2002   right    Family History  Problem Relation Age of Onset  . COPD Mother   . Alcohol abuse Mother   . Heart disease Father   . Alcohol abuse Father   . Early death Brother 104       AIDS  . Cancer  Maternal Grandmother   . Stroke Neg Hx   . Asthma Neg Hx   . Diabetes Neg Hx   . Drug abuse Neg Hx   . Hyperlipidemia Neg Hx   . Hypertension Neg Hx   . Kidney disease Neg Hx   . Colon cancer Neg Hx     Social History   Tobacco Use  . Smoking status: Never Smoker  . Smokeless tobacco: Never Used  Substance Use Topics  . Alcohol use: No    Subjective:  Patient will be traveling to [place] with his church for a mission trip in mid-October; needs to get some vaccines/ medications updated for upcoming trip;  Per trip planners, needs updated Tdap, MMR, flu; as well as oral typhoid vaccine and Chloroquine for malaria prophylaxis; In baseline state of health otherwise and very excited about trip; Will plan to see his PCP later this year;    Objective:  Vitals:   12/03/17 1350  BP: 128/70  Pulse: 64  Temp: 98.2 F (36.8 C)  TempSrc: Oral  SpO2: 96%  Weight: 159 lb 1.3 oz (72.2 kg)  Height: 5' 7"  (1.702 m)    General: Well developed, well nourished, in no acute distress  Skin : Warm and dry.  Head: Normocephalic and atraumatic  Lungs: Respirations unlabored; Neurologic: Alert and oriented; speech intact; face  symmetrical; moves all extremities well; CNII-XII intact without focal deficit   Assessment:  1. Travel advice encounter   2. Need for malaria prophylaxis     Plan:  1. MMR and Tdap updated today; he will plan to get flu shot in the next few weeks; Rx for Chlorquine and Typhoid vaccine- he understands if he cannot get these at his local pharmacy, he needs to contact the travel clinic; contact information given;  Will need MMR #2 in 1 month; Needs CPE in December;      Return for CPE with Dr. Ronnald Ramp after February 28, 2018.  Orders Placed This Encounter  Procedures  . Tdap vaccine greater than or equal to 7yo IM  . MMR vaccine subcutaneous    Requested Prescriptions   Signed Prescriptions Disp Refills  . chloroquine (ARALEN) 500 MG tablet 6 tablet 0     Sig: Take 1 tablet (500 mg total) by mouth daily. Take 1 tablet weekly, begin 1 weeks prior to travel and complete 4 weeks after travel.  . typhoid (VIVOTIF) DR capsule 4 capsule 0    Sig: Take 1 capsule by mouth every other day.

## 2019-04-30 ENCOUNTER — Ambulatory Visit (INDEPENDENT_AMBULATORY_CARE_PROVIDER_SITE_OTHER): Payer: 59 | Admitting: Internal Medicine

## 2019-04-30 ENCOUNTER — Encounter: Payer: Self-pay | Admitting: Internal Medicine

## 2019-04-30 ENCOUNTER — Other Ambulatory Visit: Payer: Self-pay

## 2019-04-30 VITALS — BP 124/82 | HR 67 | Temp 98.2°F | Resp 16 | Ht 67.0 in | Wt 159.0 lb

## 2019-04-30 DIAGNOSIS — Z860100 Personal history of colon polyps, unspecified: Secondary | ICD-10-CM

## 2019-04-30 DIAGNOSIS — K625 Hemorrhage of anus and rectum: Secondary | ICD-10-CM

## 2019-04-30 DIAGNOSIS — Z23 Encounter for immunization: Secondary | ICD-10-CM

## 2019-04-30 DIAGNOSIS — Z125 Encounter for screening for malignant neoplasm of prostate: Secondary | ICD-10-CM | POA: Diagnosis not present

## 2019-04-30 DIAGNOSIS — K219 Gastro-esophageal reflux disease without esophagitis: Secondary | ICD-10-CM | POA: Diagnosis not present

## 2019-04-30 DIAGNOSIS — Z0001 Encounter for general adult medical examination with abnormal findings: Secondary | ICD-10-CM | POA: Diagnosis not present

## 2019-04-30 DIAGNOSIS — Z0000 Encounter for general adult medical examination without abnormal findings: Secondary | ICD-10-CM

## 2019-04-30 LAB — LIPID PANEL
Cholesterol: 171 mg/dL (ref 0–200)
HDL: 51.8 mg/dL (ref >39.00)
LDL Cholesterol: 102 mg/dL — ABNORMAL HIGH (ref 0–99)
NonHDL: 119.32
Total CHOL/HDL Ratio: 3
Triglycerides: 86 mg/dL (ref 0.0–149.0)
VLDL: 17.2 mg/dL (ref 0.0–40.0)

## 2019-04-30 LAB — CBC WITH DIFFERENTIAL/PLATELET
Basophils Absolute: 0.1 10*3/uL (ref 0.0–0.1)
Basophils Relative: 2.2 % (ref 0.0–3.0)
Eosinophils Absolute: 0.1 10*3/uL (ref 0.0–0.7)
Eosinophils Relative: 1.8 % (ref 0.0–5.0)
HCT: 45.3 % (ref 39.0–52.0)
Hemoglobin: 15.5 g/dL (ref 13.0–17.0)
Lymphocytes Relative: 29.7 % (ref 12.0–46.0)
Lymphs Abs: 1.4 10*3/uL (ref 0.7–4.0)
MCHC: 34.2 g/dL (ref 30.0–36.0)
MCV: 97.9 fl (ref 78.0–100.0)
Monocytes Absolute: 0.5 10*3/uL (ref 0.1–1.0)
Monocytes Relative: 9.9 % (ref 3.0–12.0)
Neutro Abs: 2.6 10*3/uL (ref 1.4–7.7)
Neutrophils Relative %: 56.4 % (ref 43.0–77.0)
Platelets: 251 10*3/uL (ref 150.0–400.0)
RBC: 4.63 Mil/uL (ref 4.22–5.81)
RDW: 12.7 % (ref 11.5–15.5)
WBC: 4.7 10*3/uL (ref 4.0–10.5)

## 2019-04-30 LAB — PSA: PSA: 0.49 ng/mL (ref 0.10–4.00)

## 2019-04-30 MED ORDER — ZOSTER VAC RECOMB ADJUVANTED 50 MCG/0.5ML IM SUSR
0.5000 mL | Freq: Once | INTRAMUSCULAR | 1 refills | Status: DC
Start: 2019-04-30 — End: 2019-04-30

## 2019-04-30 MED ORDER — SHINGRIX 50 MCG/0.5ML IM SUSR: 0.5000 mL | Freq: Once | INTRAMUSCULAR | 1 refills | Status: DC

## 2019-04-30 MED ORDER — DEXLANSOPRAZOLE 60 MG PO CPDR: 60.0000 mg | DELAYED_RELEASE_CAPSULE | Freq: Every day | ORAL | 1 refills | Status: DC

## 2019-04-30 NOTE — Patient Instructions (Signed)

## 2019-04-30 NOTE — Progress Notes (Signed)
Subjective:  Patient ID: Timothy Hudson, male    DOB: July 21, 1955  Age: 64 y.o. MRN: [id number]  CC: Annual Exam and Gastroesophageal Reflux  This visit occurred during the SARS-CoV-2 public health emergency.  Safety protocols were in place, including screening questions prior to the visit, additional usage of staff PPE, and extensive cleaning of exam room while observing appropriate contact time as indicated for disinfecting solutions.    HPI Timothy Hudson presents for a CPX -  He complains of heartburn this not adequately well controlled with famotidine.  He denies odynophagia, dysphagia, loss of appetite, weight loss, abdominal pain, or melena.  He says that a few months ago he had a few episodes of painless bright red blood per rectum.  Outpatient Medications Prior to Visit  Medication Sig Dispense Refill  . acetaminophen (TYLENOL) 500 MG tablet Take 1,000 mg by mouth every 6 (six) hours as needed (for pain/headaches.).    Marland Kitchen Multiple Vitamin (MULTIVITAMIN WITH MINERALS) TABS tablet Take 1 tablet by mouth daily. CENTRUM SILVER 50+    . chloroquine (ARALEN) 500 MG tablet Take 1 tablet (500 mg total) by mouth daily. Take 1 tablet weekly, begin 1 weeks prior to travel and complete 4 weeks after travel. 6 tablet 0  . famotidine (PEPCID) 20 MG tablet Take 20 mg by mouth 2 (two) times daily.    Marland Kitchen ibuprofen (ADVIL,MOTRIN) 200 MG tablet Take 400-600 mg by mouth every 8 (eight) hours as needed (for pain/headaches.).    Marland Kitchen typhoid (VIVOTIF) DR capsule Take 1 capsule by mouth every other day. 4 capsule 0   No facility-administered medications prior to visit.    ROS Review of Systems  Constitutional: Negative.  Negative for appetite change, diaphoresis, fatigue and unexpected weight change.  HENT: Negative for sore throat, trouble swallowing and voice change.   Eyes: Negative.   Respiratory: Negative for cough, chest tightness and shortness of breath.   Cardiovascular: Negative for  chest pain, palpitations and leg swelling.  Gastrointestinal: Positive for anal bleeding and blood in stool. Negative for abdominal pain, diarrhea, nausea and vomiting.  Endocrine: Negative.   Genitourinary: Negative.  Negative for scrotal swelling and testicular pain.  Musculoskeletal: Negative.   Skin: Negative.   Neurological: Negative.  Negative for dizziness, weakness and light-headedness.  Hematological: Negative.  Does not bruise/bleed easily.  Psychiatric/Behavioral: Negative.     Objective:  BP 124/82 (BP Location: Left Arm, Patient Position: Sitting, Cuff Size: Normal)   Pulse 67   Temp 98.2 F (36.8 C) (Oral)   Resp 16   Ht 5\' 7"  (1.702 m)   Wt 159 lb (72.1 kg)   SpO2 97%   BMI 24.90 kg/m   BP Readings from Last 3 Encounters:  04/30/19 124/82  12/03/17 128/70  08/02/17 136/82    Wt Readings from Last 3 Encounters:  04/30/19 159 lb (72.1 kg)  12/03/17 159 lb 1.3 oz (72.2 kg)  08/02/17 156 lb 3.2 oz (70.9 kg)    Physical Exam Vitals reviewed.  HENT:     Nose: Nose normal.     Mouth/Throat:     Mouth: Mucous membranes are moist.  Eyes:     General: No scleral icterus.    Conjunctiva/sclera: Conjunctivae normal.  Cardiovascular:     Rate and Rhythm: Normal rate and regular rhythm.     Heart sounds: No murmur.  Pulmonary:     Effort: Pulmonary effort is normal.     Breath sounds: No stridor. No wheezing, rhonchi or  rales.  Abdominal:     General: Abdomen is flat. Bowel sounds are normal. There is no distension.     Palpations: Abdomen is soft. There is no hepatomegaly or splenomegaly.     Tenderness: There is no abdominal tenderness.     Hernia: No hernia is present. There is no hernia in the left inguinal area or right inguinal area.  Genitourinary:    Pubic Area: No rash.      Penis: Normal and circumcised. No discharge, swelling or lesions.      Testes: Normal.        Right: Mass not present.        Left: Mass not present.     Epididymis:      Right: Normal. Not inflamed or enlarged.     Left: Not inflamed or enlarged.     Prostate: Normal. Not enlarged, not tender and no nodules present.     Rectum: Normal. Guaiac result negative. No mass, tenderness, anal fissure or external hemorrhoid. Normal anal tone.  Musculoskeletal:        General: Normal range of motion.     Cervical back: Neck supple.     Right lower leg: No edema.     Left lower leg: No edema.  Lymphadenopathy:     Cervical: No cervical adenopathy.     Lower Body: No right inguinal adenopathy. No left inguinal adenopathy.  Skin:    General: Skin is warm and dry.  Neurological:     General: No focal deficit present.  Psychiatric:        Mood and Affect: Mood normal.        Behavior: Behavior normal.     Lab Results  Component Value Date   WBC 4.7 04/30/2019   HGB 15.5 04/30/2019   HCT 45.3 04/30/2019   PLT 251.0 04/30/2019   GLUCOSE 87 02/27/2017   CHOL 171 04/30/2019   TRIG 86.0 04/30/2019   HDL 51.80 04/30/2019   LDLCALC 102 (H) 04/30/2019   ALT 15 02/27/2017   AST 17 02/27/2017   NA 138 02/27/2017   K 4.7 02/27/2017   CL 102 02/27/2017   CREATININE 1.03 02/27/2017   BUN 15 02/27/2017   CO2 30 02/27/2017   TSH 0.37 11/25/2014   PSA 0.49 04/30/2019    No results found.  Assessment & Plan:   Timothy Hudson was seen today for annual exam and gastroesophageal reflux.  Diagnoses and all orders for this visit:  Routine general medical examination at a health care facility- Exam completed, labs reviewed, vaccines reviewed, he is referred for colon cancer screening, patient education was given. -     Lipid panel -     PSA  GERD without esophagitis- I asked him to upgrade to a PPI to achieve better symptom relief. -     CBC with Differential/Platelet -     dexlansoprazole (DEXILANT) 60 MG capsule; Take 1 capsule (60 mg total) by mouth daily.  BRBPR (bright red blood per rectum)- The bleeding is most likely related to a uncomplicated internal anal  hemorrhoids.  I have asked him to see GI to see if he needs to undergo lower endoscopy to screen for pathology. -     Ambulatory referral to Gastroenterology  Need for shingles vaccine -     Discontinue: Zoster Vaccine Adjuvanted [facility]) injection; Inject 0.5 mLs into the muscle once for 1 dose. -     Discontinue: Zoster Vaccine Adjuvanted [facility]) injection; Inject 0.5 mLs into the muscle once  for 1 dose. -     Varicella-zoster vaccine IM (Shingrix)  History of colonic polyps   I have discontinued Sharion Dove. Siegel "Keith"'s famotidine, ibuprofen, chloroquine, typhoid, and Shingrix. I am also having him start on dexlansoprazole. Additionally, I am having him maintain his multivitamin with minerals and acetaminophen.  Meds ordered this encounter  Medications  . dexlansoprazole (DEXILANT) 60 MG capsule    Sig: Take 1 capsule (60 mg total) by mouth daily.    Dispense:  90 capsule    Refill:  1  . DISCONTD: Zoster Vaccine Adjuvanted [facility]) injection    Sig: Inject 0.5 mLs into the muscle once for 1 dose.    Dispense:  0.5 mL    Refill:  1  . DISCONTD: Zoster Vaccine Adjuvanted [facility]) injection    Sig: Inject 0.5 mLs into the muscle once for 1 dose.    Dispense:  0.5 mL    Refill:  1     Follow-up: Return in about 6 months (around 10/28/2019).  Scarlette Calico, MD

## 2019-05-07 ENCOUNTER — Other Ambulatory Visit: Payer: Self-pay | Admitting: Internal Medicine

## 2019-05-07 DIAGNOSIS — K219 Gastro-esophageal reflux disease without esophagitis: Secondary | ICD-10-CM

## 2019-05-07 MED ORDER — ESOMEPRAZOLE MAGNESIUM 40 MG PO CPDR: 40.0000 mg | DELAYED_RELEASE_CAPSULE | Freq: Every day | ORAL | 1 refills | Status: DC

## 2019-05-28 ENCOUNTER — Ambulatory Visit: Payer: 59 | Admitting: Internal Medicine

## 2019-05-28 ENCOUNTER — Encounter: Payer: Self-pay | Admitting: Internal Medicine

## 2019-05-28 ENCOUNTER — Other Ambulatory Visit: Payer: Self-pay

## 2019-05-28 VITALS — BP 124/80 | HR 72 | Temp 98.7°F | Ht 65.0 in | Wt 160.0 lb

## 2019-05-28 DIAGNOSIS — K648 Other hemorrhoids: Secondary | ICD-10-CM | POA: Diagnosis not present

## 2019-05-28 DIAGNOSIS — K625 Hemorrhage of anus and rectum: Secondary | ICD-10-CM | POA: Diagnosis not present

## 2019-05-28 DIAGNOSIS — Z01818 Encounter for other preprocedural examination: Secondary | ICD-10-CM | POA: Diagnosis not present

## 2019-05-28 DIAGNOSIS — Z86010 Personal history of colonic polyps: Secondary | ICD-10-CM | POA: Diagnosis not present

## 2019-05-28 MED ORDER — NA SULFATE-K SULFATE-MG SULF 17.5-3.13-1.6 GM/177ML PO SOLN: 1.0000 | Freq: Once | ORAL | 0 refills | Status: AC

## 2019-05-28 NOTE — Patient Instructions (Signed)
If you are age 65 or older, your body mass index should be between 23-30. Your Body mass index is 26.63 kg/m. If this is out of the aforementioned range listed, please consider follow up with your Primary Care Provider.  If you are age 39 or younger, your body mass index should be between 19-25. Your Body mass index is 26.63 kg/m. If this is out of the aformentioned range listed, please consider follow up with your Primary Care Provider.   You have been scheduled for a colonoscopy. Please follow written instructions given to you at your visit today.  Please pick up your prep supplies at the pharmacy within the next 1-3 days. If you use inhalers (even only as needed), please bring them with you on the day of your procedure.

## 2019-05-28 NOTE — Progress Notes (Signed)
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  The patient has a history of adenomatous colon polyps and diverticulosis.  Previous colonoscopies performed 2007, 2011, and most recently April 2017.  Tubular adenoma, diverticulosis, and internal hemorrhoids on the most recent exam.  Follow-up in 5 years recommended.  He tells me that over the years he occasionally sees some bright red blood limited to the tissue.  However in November and again in December he had gross rectal bleeding as manifested by blood associated with the stool and in the toilet bowl.  The water was discolored.  There was no associated abdominal or rectal pain though he felt a bit queasy.  He denies change in bowel habits or unexplained weight loss.  He does have a history of GERD for which he takes Nexium with good control of symptoms.  No dysphagia.  Review of outside blood work from April 30, 2019 shows normal hemoglobin of 15.5.  REVIEW OF SYSTEMS:  All non-GI ROS negative unless otherwise stated in the HPI except for arthritis  Past Medical History:  Diagnosis Date  . Arthritis   . 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 GERD (gastroesophageal reflux disease)   . Pneumonia     Past Surgical History:  Procedure Laterality Date  . CARPAL TUNNEL RELEASE Right 08/02/2017   Procedure: RIGHT LIMITED OPEN CARPAL TUNNEL RELEASE;  Surgeon: Roseanne Kaufman, MD;  Location: [place];  Service: Orthopedics;  Laterality: Right;  60 MINS  . COLONOSCOPY    . INGUINAL HERNIA REPAIR  [phone]   right    Social History Timothy Hudson  reports that he has never smoked. He has never used smokeless tobacco. He reports that he does not drink alcohol or use drugs.  family history includes Alcohol abuse in his father and mother; Breast cancer in his  maternal grandmother; COPD in his mother; Early death (age of onset: 38) in his brother; Heart disease in his father; Lupus in his son.  Allergies  Allergen Reactions  . Mobic [Meloxicam] Other (See Comments)    Blisters in mouth. Pt takes ibuprofen at home.  . Prednisone Other (See Comments)    INSOMNIA (HYPERACTIVITY)/MOODS CHANGES  . Versed [Midazolam] Anxiety and Other (See Comments)       PHYSICAL EXAMINATION: Vital signs: BP 124/80 (BP Location: Left Arm, Patient Position: Sitting, Cuff Size: Normal)   Pulse 72   Temp 98.7 F (37.1 C)   Ht 5\' 5"  (1.651 m) Comment: height measured without shoes  Wt 160 lb (72.6 kg)   BMI 26.63 kg/m   Constitutional: generally well-appearing, no acute distress Psychiatric: alert and oriented x3, cooperative Eyes: extraocular movements intact, anicteric, conjunctiva pink Mouth: oral pharynx moist, no lesions Neck: supple no lymphadenopathy Cardiovascular: heart regular rate and rhythm, no murmur Lungs: clear to auscultation bilaterally Abdomen: soft, nontender, nondistended, no obvious ascites, no peritoneal signs, normal bowel sounds, no organomegaly Rectal: No external abnormalities.  Internal hemorrhoids noted.  Brown stool. Extremities: no clubbing, cyanosis, or lower extremity edema bilaterally Skin: no lesions on visible extremities Neuro: No focal deficits.  Cranial nerves intact  ASSESSMENT:  1.  Moderately severe episodic rectal bleeding.  Possibly known internal hemorrhoids.  Question a self-limited diverticular bleed.  Rule out interval neoplasia. 2.  History of adenomatous colon polyps.  Last examination  2017   PLAN:  1.  Schedule colonoscopy to evaluate bleeding.The nature of the procedure, as well as the risks, benefits, and alternatives were carefully and thoroughly reviewed with the patient. Ample time for discussion and questions allowed. The patient understood, was satisfied, and agreed to proceed. A total time of 45  minutes was used preparing to see the patient, reviewing test, obtaining comprehensive history, performing comprehensive physical examination, counseling the patient regarding his above listed issues, ordering advanced procedure, and documenting clinical information in the health record.

## 2019-06-20 ENCOUNTER — Other Ambulatory Visit: Payer: Self-pay

## 2019-06-20 ENCOUNTER — Other Ambulatory Visit: Payer: Self-pay | Admitting: Internal Medicine

## 2019-06-20 ENCOUNTER — Ambulatory Visit (INDEPENDENT_AMBULATORY_CARE_PROVIDER_SITE_OTHER): Payer: PRIVATE HEALTH INSURANCE

## 2019-06-20 DIAGNOSIS — Z1159 Encounter for screening for other viral diseases: Secondary | ICD-10-CM

## 2019-06-21 LAB — SARS CORONAVIRUS 2 (TAT 6-24 HRS): SARS Coronavirus 2: NEGATIVE

## 2019-06-24 ENCOUNTER — Ambulatory Visit (AMBULATORY_SURGERY_CENTER): Payer: 59 | Admitting: Internal Medicine

## 2019-06-24 ENCOUNTER — Other Ambulatory Visit: Payer: Self-pay

## 2019-06-24 ENCOUNTER — Encounter: Payer: Self-pay | Admitting: Internal Medicine

## 2019-06-24 VITALS — BP 124/76 | HR 54 | Temp 96.6°F | Resp 9 | Ht 65.0 in | Wt 160.0 lb

## 2019-06-24 DIAGNOSIS — K5730 Diverticulosis of large intestine without perforation or abscess without bleeding: Secondary | ICD-10-CM

## 2019-06-24 DIAGNOSIS — K648 Other hemorrhoids: Secondary | ICD-10-CM | POA: Diagnosis not present

## 2019-06-24 DIAGNOSIS — K625 Hemorrhage of anus and rectum: Secondary | ICD-10-CM | POA: Diagnosis present

## 2019-06-24 DIAGNOSIS — Z86010 Personal history of colonic polyps: Secondary | ICD-10-CM

## 2019-06-24 LAB — HM COLONOSCOPY

## 2019-06-24 MED ORDER — SODIUM CHLORIDE 0.9 % IV SOLN: 500.0000 mL | Freq: Once | INTRAVENOUS | Status: DC

## 2019-06-24 NOTE — Patient Instructions (Signed)
YOU HAD AN ENDOSCOPIC PROCEDURE TODAY AT THE [facility]:   Refer to the procedure report that was given to you for any specific questions about what was found during the examination.  If the procedure report does not answer your questions, please call your gastroenterologist to clarify.  If you requested that your care partner not be given the details of your procedure findings, then the procedure report has been included in a sealed envelope for you to review at your convenience later.  YOU SHOULD EXPECT: Some feelings of bloating in the abdomen. Passage of more gas than usual.  Walking can help get rid of the air that was put into your GI tract during the procedure and reduce the bloating. If you had a lower endoscopy (such as a colonoscopy or flexible sigmoidoscopy) you may notice spotting of blood in your stool or on the toilet paper. If you underwent a bowel prep for your procedure, you may not have a normal bowel movement for a few days.  Please Note:  You might notice some irritation and congestion in your nose or some drainage.  This is from the oxygen used during your procedure.  There is no need for concern and it should clear up in a day or so.  SYMPTOMS TO REPORT IMMEDIATELY:   Following lower endoscopy (colonoscopy or flexible sigmoidoscopy):  Excessive amounts of blood in the stool  Significant tenderness or worsening of abdominal pains  Swelling of the abdomen that is new, acute  Fever of 100F or higher  For urgent or emergent issues, a gastroenterologist can be reached at any hour by calling [phone]. Do not use MyChart messaging for urgent concerns.    DIET:  We do recommend a small meal at first, but then you may proceed to your regular diet.  Drink plenty of fluids but you should avoid alcoholic beverages for 24 hours.  ACTIVITY:  You should plan to take it easy for the rest of today and you should NOT DRIVE or use heavy machinery until tomorrow (because  of the sedation medicines used during the test).    FOLLOW UP: Our staff will call the number listed on your records 48-72 hours following your procedure to check on you and address any questions or concerns that you may have regarding the information given to you following your procedure. If we do not reach you, we will leave a message.  We will attempt to reach you two times.  During this call, we will ask if you have developed any symptoms of COVID 19. If you develop any symptoms (ie: fever, flu-like symptoms, shortness of breath, cough etc.) before then, please call [phone].  If you test positive for Covid 19 in the 2 weeks post procedure, please call and report this information to us.    If any biopsies were taken you will be contacted by phone or by letter within the next 1-3 weeks.  Please call us at [phone] if you have not heard about the biopsies in 3 weeks.    SIGNATURES/CONFIDENTIALITY: You and/or your care partner have signed paperwork which will be entered into your electronic medical record.  These signatures attest to the fact that that the information above on your After Visit Summary has been reviewed and is understood.  Full responsibility of the confidentiality of this discharge information lies with you and/or your care-partner. 

## 2019-06-24 NOTE — Progress Notes (Signed)
PT taken to PACU. Monitors in place. VSS. Report given to RN. 

## 2019-06-24 NOTE — Op Note (Signed)
[place] Patient Name: Timothy Hudson Procedure Date: 06/24/2019 2:48 PM MRN: [id number] Endoscopist: Docia Chuck. Henrene Pastor , MD Age: 64 Referring MD:  Date of Birth: 1955/12/27 Gender: Male Account #: [id number] Procedure:                Colonoscopy Indications:              Rectal bleeding. Also, patient with a history of                            multiple adenomatous colon polyps. Previous                            examinations 2007, 2011, 2017 Medicines:                Monitored Anesthesia Care Procedure:                Pre-Anesthesia Assessment:                           - Prior to the procedure, a History and Physical                            was performed, and patient medications and                            allergies were reviewed. The patient's tolerance of                            previous anesthesia was also reviewed. The risks                            and benefits of the procedure and the sedation                            options and risks were discussed with the patient.                            All questions were answered, and informed consent                            was obtained. Prior Anticoagulants: The patient has                            taken no previous anticoagulant or antiplatelet                            agents. ASA Grade Assessment: II - A patient with                            mild systemic disease. After reviewing the risks                            and benefits, the patient was deemed in  satisfactory condition to undergo the procedure.                           After obtaining informed consent, the colonoscope                            was passed under direct vision. Throughout the                            procedure, the patient's blood pressure, pulse, and                            oxygen saturations were monitored continuously. The                            Colonoscope was introduced through the  anus and                            advanced to the the cecum, identified by                            appendiceal orifice and ileocecal valve. The                            ileocecal valve, appendiceal orifice, and rectum                            were photographed. The quality of the bowel                            preparation was excellent. The colonoscopy was                            performed without difficulty. The patient tolerated                            the procedure well. The bowel preparation used was                            SUPREP via split dose instruction. Scope In: 2:58:18 PM Scope Out: 3:07:50 PM Scope Withdrawal Time: 0 hours 6 minutes 17 seconds  Total Procedure Duration: 0 hours 9 minutes 32 seconds  Findings:                 Multiple diverticula were found in the sigmoid                            colon.                           Internal hemorrhoids were found during                            retroflexion. The hemorrhoids were moderate.  The exam was otherwise without abnormality on                            direct and retroflexion views. Complications:            No immediate complications. Estimated blood loss:                            None. Estimated Blood Loss:     Estimated blood loss: none. Impression:               - Diverticulosis in the sigmoid colon.                           - Internal hemorrhoids.                           - The examination was otherwise normal on direct                            and retroflexion views.                           - No specimens collected. Recommendation:           - Repeat colonoscopy in 5 years for surveillance                            (personal history of multiple polyps).                           - Patient has a contact number available for                            emergencies. The signs and symptoms of potential                            delayed complications were  discussed with the                            patient. Return to normal activities tomorrow.                            Written discharge instructions were provided to the                            patient.                           - Resume previous diet.                           - Continue present medications. Docia Chuck. Henrene Pastor, MD 06/24/2019 3:12:13 PM This report has been signed electronically.

## 2019-06-24 NOTE — Progress Notes (Signed)
Temp-LC VS-DT 

## 2019-06-26 ENCOUNTER — Telehealth: Payer: Self-pay

## 2019-06-26 NOTE — Telephone Encounter (Signed)
  Follow up Call-  Call back number 06/24/2019  Post procedure Call Back phone  # [phone]  Permission to leave phone message Yes  Some recent data might be hidden     Patient questions:  Do you have a fever, pain , or abdominal swelling? No. Pain Score  0 *  Have you tolerated food without any problems? Yes.    Have you been able to return to your normal activities? Yes.    Do you have any questions about your discharge instructions: Diet   No. Medications  No. Follow up visit  No.  Do you have questions or concerns about your Care? No.  Actions: * If pain score is 4 or above: No action needed, pain <4.  1. Have you developed a fever since your procedure? no  2.   Have you had an respiratory symptoms (SOB or cough) since your procedure? no  3.   Have you tested positive for COVID 19 since your procedure no  4.   Have you had any family members/close contacts diagnosed with the COVID 19 since your procedure?  no   If yes to any of these questions please route to Joylene John, RN and Erenest Rasher, RN

## 2019-07-02 ENCOUNTER — Ambulatory Visit
Admission: EM | Admit: 2019-07-02 | Discharge: 2019-07-02 | Disposition: A | Discharge status: Home / Self Care | Payer: PRIVATE HEALTH INSURANCE | Attending: Physician Assistant | Admitting: Physician Assistant

## 2019-07-02 DIAGNOSIS — W268XXA Contact with other sharp object(s), not elsewhere classified, initial encounter: Secondary | ICD-10-CM | POA: Diagnosis not present

## 2019-07-02 DIAGNOSIS — S61210A Laceration without foreign body of right index finger without damage to nail, initial encounter: Secondary | ICD-10-CM | POA: Diagnosis not present

## 2019-07-02 NOTE — Discharge Instructions (Signed)
2 sutures placed. You can remove current dressing in 24 hours. Keep wound clean and dry. You can clean gently with soap and water. Do not soak area in water. Monitor for spreading redness, increased warmth, increased swelling, fever, follow up for reevaluation needed. Otherwise follow up in 7 days for suture removal.

## 2019-07-02 NOTE — ED Triage Notes (Signed)
Pt states cut his rt index finger with the medal hook on a bungie cord. Pressure dressing applied, active bleeding noted

## 2019-07-02 NOTE — ED Provider Notes (Signed)
[facility]    CSN: [id number] Arrival date & time: 07/02/19  1406      History   Chief Complaint Chief Complaint  Patient presents with  . Laceration    HPI Timothy Hudson is a 64 y.o. male.   64 year old male comes in for laceration to the right index finger by metal hook prior to arrival. States tried pressure dressing, but area continued to bleed and therefore came in for evaluation. Denies numbness/tingling. Denies decrease in ROM. Tetanus up to date.      Past Medical History:  Diagnosis Date  . Arthritis   . 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 GERD (gastroesophageal reflux disease)   . Pneumonia     Patient Active Problem List   Diagnosis Date Noted  . GERD without esophagitis 04/30/2019  . BRBPR (bright red blood per rectum) 04/30/2019  . Need for shingles vaccine 02/27/2017  . Carpal tunnel syndrome on right 02/27/2017  . Routine general medical examination at a health care facility 11/25/2014  . History of colonic polyps 11/25/2014    Past Surgical History:  Procedure Laterality Date  . CARPAL TUNNEL RELEASE Right 08/02/2017   Procedure: RIGHT LIMITED OPEN CARPAL TUNNEL RELEASE;  Surgeon: Roseanne Kaufman, MD;  Location: [facility];  Service: Orthopedics;  Laterality: Right;  60 MINS  . COLONOSCOPY    . INGUINAL HERNIA REPAIR  1981,2000,2002   right       Home Medications    Prior to Admission medications   Medication Sig Start Date End Date Taking? Authorizing Provider  acetaminophen (TYLENOL) 500 MG tablet Take 1,000 mg by mouth every 6 (six) hours as needed (for pain/headaches.).    [provider]  esomeprazole (NEXIUM) 40 MG capsule Take 1 capsule (40 mg total) by mouth daily at 12 noon. 05/07/19   Janith Lima, MD  ibuprofen (ADVIL) 200 MG tablet Take 200 mg by mouth every 6 (six) hours as needed.    [provider]  Multiple Vitamin (MULTIVITAMIN WITH MINERALS) TABS  tablet Take 1 tablet by mouth daily. CENTRUM SILVER 50+    [provider]    Family History Family History  Problem Relation Age of Onset  . COPD Mother   . Alcohol abuse Mother   . Heart disease Father   . Alcohol abuse Father   . Early death Brother 19       AIDS  . Breast cancer Maternal Grandmother   . Lupus Son   . Stroke Neg Hx   . Asthma Neg Hx   . Diabetes Neg Hx   . Drug abuse Neg Hx   . Hyperlipidemia Neg Hx   . Hypertension Neg Hx   . Kidney disease Neg Hx   . Colon cancer Neg Hx   . Rectal cancer Neg Hx   . Stomach cancer Neg Hx   . Esophageal cancer Neg Hx     Social History Social History   Tobacco Use  . Smoking status: Never Smoker  . Smokeless tobacco: Never Used  Substance Use Topics  . Alcohol use: No  . Drug use: No     Allergies   Mobic [meloxicam], Prednisone, and Versed [midazolam]   Review of Systems Review of Systems  Reason unable to perform ROS: See HPI as above.     Physical Exam Triage Vital Signs ED Triage Vitals [07/02/19 1417]  Enc Vitals Group  BP (!) 156/71     Pulse Rate 68     Resp 16     Temp 98.1 F (36.7 C)     Temp Source Oral     SpO2 97 %     Weight      Height      Head Circumference      Peak Flow      Pain Score 0     Pain Loc      Pain Edu?      Excl. in [employer]?    No data found.  Updated Vital Signs BP (!) 156/71 (BP Location: Left Arm)   Pulse 68   Temp 98.1 F (36.7 C) (Oral)   Resp 16   SpO2 97%   Visual Acuity Right Eye Distance:   Left Eye Distance:   Bilateral Distance:    Right Eye Near:   Left Eye Near:    Bilateral Near:     Physical Exam Constitutional:      General: He is not in acute distress.    Appearance: Normal appearance. He is well-developed. He is not toxic-appearing or diaphoretic.  HENT:     Head: Normocephalic and atraumatic.  Eyes:     Conjunctiva/sclera: Conjunctivae normal.     Pupils: Pupils are equal, round, and reactive to light.    Pulmonary:     Effort: Pulmonary effort is normal. No respiratory distress.     Comments: Speaking in full sentences without difficulty Musculoskeletal:     Cervical back: Normal range of motion and neck supple.     Comments: Right index finger laceration. 1cm in length to PIP. Bleeding controlled with pressure. Full ROM of finger. NVI  Skin:    General: Skin is warm and dry.  Neurological:     Mental Status: He is alert and oriented to person, place, and time.      UC Treatments / Results  Labs (all labs ordered are listed, but only abnormal results are displayed) Labs Reviewed - No data to display  EKG   Radiology No results found.  Procedures Laceration Repair  Date/Time: 07/02/2019 10:02 PM Performed by: Ok Edwards, PA-C Authorized by: Ok Edwards, PA-C   Consent:    Consent obtained:  Verbal   Consent given by:  Patient   Risks discussed:  Infection, pain, poor cosmetic result and poor wound healing   Alternatives discussed:  No treatment Anesthesia (see MAR for exact dosages):    Anesthesia method:  Local infiltration   Local anesthetic:  Lidocaine 1% w/o epi Laceration details:    Location:  Finger   Finger location:  R index finger   Length (cm):  1   Depth (mm):  3 Repair type:    Repair type:  Simple Pre-procedure details:    Preparation:  Patient was prepped and draped in usual sterile fashion and imaging obtained to evaluate for foreign bodies Exploration:    Hemostasis achieved with:  Direct pressure   Wound exploration: wound explored through full range of motion and entire depth of wound probed and visualized     Contaminated: no   Treatment:    Area cleansed with:  Hibiclens   Amount of cleaning:  Standard   Irrigation solution:  Sterile saline   Irrigation method:  Pressure wash   Visualized foreign bodies/material removed: no   Skin repair:    Repair method:  Sutures   Suture size:  5-0   Suture material:  Prolene  Suture technique:   Simple interrupted   Number of sutures:  2 Approximation:    Approximation:  Close Post-procedure details:    Dressing:  Antibiotic ointment and bulky dressing   Patient tolerance of procedure:  Tolerated well, no immediate complications   (including critical care time)  Medications Ordered in UC Medications - No data to display  Initial Impression / Assessment and Plan / UC Course  I have reviewed the triage vital signs and the nursing notes.  Pertinent labs & imaging results that were available during my care of the patient were reviewed by me and considered in my medical decision making (see chart for details).    Patient tolerated procedure well. 2 sutures placed. Wound care instructions given. Return precautions given. Otherwise, follow up in 7 days for suture removal. Patient expresses understanding and agrees to plan.   Final Clinical Impressions(s) / UC Diagnoses   Final diagnoses:  Laceration of right index finger without foreign body without damage to nail, initial encounter    ED Prescriptions    None     PDMP not reviewed this encounter.   Ok Edwards, PA-C 07/02/19 2206

## 2019-07-09 ENCOUNTER — Encounter: Payer: Self-pay | Admitting: Emergency Medicine

## 2019-07-09 ENCOUNTER — Ambulatory Visit
Admission: EM | Admit: 2019-07-09 | Discharge: 2019-07-09 | Disposition: A | Discharge status: Home / Self Care | Payer: PRIVATE HEALTH INSURANCE

## 2019-07-09 ENCOUNTER — Other Ambulatory Visit: Payer: Self-pay

## 2019-07-09 NOTE — ED Triage Notes (Addendum)
2 sutures placed to right index finger.  Procedural note reports 2 sutures.  Sutures placed on 4/7

## 2019-10-08 ENCOUNTER — Ambulatory Visit (INDEPENDENT_AMBULATORY_CARE_PROVIDER_SITE_OTHER): Payer: 59 | Admitting: Internal Medicine

## 2019-10-08 ENCOUNTER — Other Ambulatory Visit: Payer: Self-pay

## 2019-10-08 ENCOUNTER — Encounter: Payer: Self-pay | Admitting: Internal Medicine

## 2019-10-08 VITALS — BP 132/82 | HR 65 | Temp 98.2°F | Resp 16 | Ht 65.0 in | Wt 156.2 lb

## 2019-10-08 DIAGNOSIS — Z23 Encounter for immunization: Secondary | ICD-10-CM | POA: Diagnosis not present

## 2019-10-08 DIAGNOSIS — K219 Gastro-esophageal reflux disease without esophagitis: Secondary | ICD-10-CM

## 2019-10-08 DIAGNOSIS — Z09 Encounter for follow-up examination after completed treatment for conditions other than malignant neoplasm: Secondary | ICD-10-CM

## 2019-10-08 NOTE — Patient Instructions (Signed)
Gastroesophageal Reflux Disease, Adult Gastroesophageal reflux (GER) happens when acid from the stomach flows up into the tube that connects the mouth and the stomach (esophagus). Normally, food travels down the esophagus and stays in the stomach to be digested. However, when a person has GER, food and stomach acid sometimes move back up into the esophagus. If this becomes a more serious problem, the person may be diagnosed with a disease called gastroesophageal reflux disease (GERD). GERD occurs when the reflux:  Happens often.  Causes frequent or severe symptoms.  Causes problems such as damage to the esophagus. When stomach acid comes in contact with the esophagus, the acid may cause soreness (inflammation) in the esophagus. Over time, GERD may create small holes (ulcers) in the lining of the esophagus. What are the causes? This condition is caused by a problem with the muscle between the esophagus and the stomach (lower esophageal sphincter, or LES). Normally, the LES muscle closes after food passes through the esophagus to the stomach. When the LES is weakened or abnormal, it does not close properly, and that allows food and stomach acid to go back up into the esophagus. The LES can be weakened by certain dietary substances, medicines, and medical conditions, including:  Tobacco use.  Pregnancy.  Having a hiatal hernia.  Alcohol use.  Certain foods and beverages, such as coffee, chocolate, onions, and peppermint. What increases the risk? You are more likely to develop this condition if you:  Have an increased body weight.  Have a connective tissue disorder.  Use NSAID medicines. What are the signs or symptoms? Symptoms of this condition include:  Heartburn.  Difficult or painful swallowing.  The feeling of having a lump in the throat.  Abitter taste in the mouth.  Bad breath.  Having a large amount of saliva.  Having an upset or bloated  stomach.  Belching.  Chest pain. Different conditions can cause chest pain. Make sure you see your health care provider if you experience chest pain.  Shortness of breath or wheezing.  Ongoing (chronic) cough or a night-time cough.  Wearing away of tooth enamel.  Weight loss. How is this diagnosed? Your health care provider will take a medical history and perform a physical exam. To determine if you have mild or severe GERD, your health care provider may also monitor how you respond to treatment. You may also have tests, including:  A test to examine your stomach and esophagus with a small camera (endoscopy).  A test thatmeasures the acidity level in your esophagus.  A test thatmeasures how much pressure is on your esophagus.  A barium swallow or modified barium swallow test to show the shape, size, and functioning of your esophagus. How is this treated? The goal of treatment is to help relieve your symptoms and to prevent complications. Treatment for this condition may vary depending on how severe your symptoms are. Your health care provider may recommend:  Changes to your diet.  Medicine.  Surgery. Follow these instructions at home: Eating and drinking   Follow a diet as recommended by your health care provider. This may involve avoiding foods and drinks such as: ? Coffee and tea (with or without caffeine). ? Drinks that containalcohol. ? Energy drinks and sports drinks. ? Carbonated drinks or sodas. ? Chocolate and cocoa. ? Peppermint and mint flavorings. ? Garlic and onions. ? Horseradish. ? Spicy and acidic foods, including peppers, chili powder, curry powder, vinegar, hot sauces, and barbecue sauce. ? Citrus fruit juices and citrus   fruits, such as oranges, lemons, and limes. ? Tomato-based foods, such as red sauce, chili, salsa, and pizza with red sauce. ? Fried and fatty foods, such as donuts, french fries, potato chips, and high-fat dressings. ? High-fat  meats, such as hot dogs and fatty cuts of red and white meats, such as rib eye steak, sausage, ham, and bacon. ? High-fat dairy items, such as whole milk, butter, and cream cheese.  Eat small, frequent meals instead of large meals.  Avoid drinking large amounts of liquid with your meals.  Avoid eating meals during the 2-3 hours before bedtime.  Avoid lying down right after you eat.  Do not exercise right after you eat. Lifestyle   Do not use any products that contain nicotine or tobacco, such as cigarettes, e-cigarettes, and chewing tobacco. If you need help quitting, ask your health care provider.  Try to reduce your stress by using methods such as yoga or meditation. If you need help reducing stress, ask your health care provider.  If you are overweight, reduce your weight to an amount that is healthy for you. Ask your health care provider for guidance about a safe weight loss goal. General instructions  Pay attention to any changes in your symptoms.  Take over-the-counter and prescription medicines only as told by your health care provider. Do not take aspirin, ibuprofen, or other NSAIDs unless your health care provider told you to do so.  Wear loose-fitting clothing. Do not wear anything tight around your waist that causes pressure on your abdomen.  Raise (elevate) the head of your bed about 6 inches (15 cm).  Avoid bending over if this makes your symptoms worse.  Keep all follow-up visits as told by your health care provider. This is important. Contact a health care provider if:  You have: ? New symptoms. ? Unexplained weight loss. ? Difficulty swallowing or it hurts to swallow. ? Wheezing or a persistent cough. ? A hoarse voice.  Your symptoms do not improve with treatment. Get help right away if you:  Have pain in your arms, neck, jaw, teeth, or back.  Feel sweaty, dizzy, or light-headed.  Have chest pain or shortness of breath.  Vomit and your vomit looks  like blood or coffee grounds.  Faint.  Have stool that is bloody or black.  Cannot swallow, drink, or eat. Summary  Gastroesophageal reflux happens when acid from the stomach flows up into the esophagus. GERD is a disease in which the reflux happens often, causes frequent or severe symptoms, or causes problems such as damage to the esophagus.  Treatment for this condition may vary depending on how severe your symptoms are. Your health care provider may recommend diet and lifestyle changes, medicine, or surgery.  Contact a health care provider if you have new or worsening symptoms.  Take over-the-counter and prescription medicines only as told by your health care provider. Do not take aspirin, ibuprofen, or other NSAIDs unless your health care provider told you to do so.  Keep all follow-up visits as told by your health care provider. This is important. This information is not intended to replace advice given to you by your health care provider. Make sure you discuss any questions you have with your health care provider. Document Revised: 09/19/2017 Document Reviewed: 09/19/2017 [employer]vier Patient Education  2020 [employer]  

## 2019-10-09 NOTE — Progress Notes (Signed)
Subjective:  Patient ID: Timothy Hudson, male    DOB: 06-19-1955  Age: 64 y.o. MRN: [id number]  CC: Gastroesophageal Reflux  This visit occurred during the SARS-CoV-2 public health emergency.  Safety protocols were in place, including screening questions prior to the visit, additional usage of staff PPE, and extensive cleaning of exam room while observing appropriate contact time as indicated for disinfecting solutions.    HPI Timothy Hudson presents for f/up - When I last saw him a few months ago there was some concern for lower GI bleeding.  He ended up seeing gastroenterology and had a colonoscopy done.  It was unremarkable.  He tells me his heartburn is well controlled with the PPI.  He denies odynophagia, dysphagia, loss of appetite, or weight loss.  He has intentionally lost weight.  Outpatient Medications Prior to Visit  Medication Sig Dispense Refill  . acetaminophen (TYLENOL) 500 MG tablet Take 1,000 mg by mouth every 6 (six) hours as needed (for pain/headaches.).    Marland Kitchen esomeprazole (NEXIUM) 40 MG capsule Take 1 capsule (40 mg total) by mouth daily at 12 noon. 90 capsule 1  . ibuprofen (ADVIL) 200 MG tablet Take 200 mg by mouth every 6 (six) hours as needed.    . Multiple Vitamin (MULTIVITAMIN WITH MINERALS) TABS tablet Take 1 tablet by mouth daily. CENTRUM SILVER 50+     No facility-administered medications prior to visit.    ROS Review of Systems  Constitutional: Negative.  Negative for diaphoresis and fatigue.  HENT: Negative.  Negative for trouble swallowing and voice change.   Eyes: Negative.   Respiratory: Negative for cough, chest tightness, shortness of breath and wheezing.   Cardiovascular: Negative for chest pain, palpitations and leg swelling.  Gastrointestinal: Negative for abdominal pain, anal bleeding, blood in stool, diarrhea, nausea and vomiting.  Endocrine: Negative.   Genitourinary: Negative.  Negative for difficulty urinating.  Musculoskeletal:  Negative.  Negative for arthralgias.  Skin: Negative.  Negative for color change.  Neurological: Negative.  Negative for dizziness and weakness.  Hematological: Negative for adenopathy. Does not bruise/bleed easily.  Psychiatric/Behavioral: Negative.     Objective:  BP 132/82 (BP Location: Left Arm, Patient Position: Sitting, Cuff Size: Normal)   Pulse 65   Temp 98.2 F (36.8 C) (Oral)   Resp 16   Ht 5\' 5"  (1.651 m)   Wt 156 lb 4 oz (70.9 kg)   SpO2 96%   BMI 26.00 kg/m   BP Readings from Last 3 Encounters:  10/08/19 132/82  07/02/19 (!) 156/71  06/24/19 124/76    Wt Readings from Last 3 Encounters:  10/08/19 156 lb 4 oz (70.9 kg)  06/24/19 160 lb (72.6 kg)  05/28/19 160 lb (72.6 kg)    Physical Exam Vitals reviewed.  Constitutional:      Appearance: Normal appearance.  HENT:     Nose: Nose normal.     Mouth/Throat:     Mouth: Mucous membranes are moist.  Eyes:     General: No scleral icterus.    Conjunctiva/sclera: Conjunctivae normal.  Cardiovascular:     Rate and Rhythm: Normal rate and regular rhythm.     Heart sounds: No murmur heard.   Pulmonary:     Effort: Pulmonary effort is normal.     Breath sounds: No stridor. No wheezing, rhonchi or rales.  Abdominal:     General: Abdomen is flat. Bowel sounds are normal. There is no distension.     Palpations: Abdomen is soft. There is  no hepatomegaly, splenomegaly or mass.  Musculoskeletal:        General: Normal range of motion.     Cervical back: Neck supple.     Right lower leg: No edema.     Left lower leg: No edema.  Lymphadenopathy:     Cervical: No cervical adenopathy.  Skin:    General: Skin is warm and dry.     Coloration: Skin is not pale.  Neurological:     General: No focal deficit present.     Mental Status: He is alert.  Psychiatric:        Mood and Affect: Mood normal.        Behavior: Behavior normal.     Lab Results  Component Value Date   WBC 4.7 04/30/2019   HGB 15.5  04/30/2019   HCT 45.3 04/30/2019   PLT 251.0 04/30/2019   GLUCOSE 87 02/27/2017   CHOL 171 04/30/2019   TRIG 86.0 04/30/2019   HDL 51.80 04/30/2019   LDLCALC 102 (H) 04/30/2019   ALT 15 02/27/2017   AST 17 02/27/2017   NA 138 02/27/2017   K 4.7 02/27/2017   CL 102 02/27/2017   CREATININE 1.03 02/27/2017   BUN 15 02/27/2017   CO2 30 02/27/2017   TSH 0.37 11/25/2014   PSA 0.49 04/30/2019    No results found.  Assessment & Plan:   Florentino was seen today for gastroesophageal reflux.  Diagnoses and all orders for this visit:  Follow up  Need for shingles vaccine -     Varicella-zoster vaccine IM (Shingrix)  GERD without esophagitis- His symptoms are well controlled.  Will continue the current dose of the PPI.   I am having Sharion Dove. Alferd Apa "Lanny Hurst" maintain his multivitamin with minerals, acetaminophen, esomeprazole, and ibuprofen.  No orders of the defined types were placed in this encounter.    Follow-up: Return in about 6 months (around 04/09/2020).  Scarlette Calico, MD

## 2019-10-10 ENCOUNTER — Encounter: Payer: Self-pay | Admitting: Internal Medicine

## 2019-12-02 ENCOUNTER — Other Ambulatory Visit: Payer: Self-pay | Admitting: Internal Medicine

## 2019-12-02 DIAGNOSIS — K219 Gastro-esophageal reflux disease without esophagitis: Secondary | ICD-10-CM

## 2020-04-08 ENCOUNTER — Encounter: Payer: Self-pay | Admitting: Internal Medicine

## 2020-04-08 ENCOUNTER — Ambulatory Visit (INDEPENDENT_AMBULATORY_CARE_PROVIDER_SITE_OTHER): Payer: 59 | Admitting: Internal Medicine

## 2020-04-08 ENCOUNTER — Other Ambulatory Visit: Payer: Self-pay

## 2020-04-08 ENCOUNTER — Ambulatory Visit (INDEPENDENT_AMBULATORY_CARE_PROVIDER_SITE_OTHER): Payer: 59

## 2020-04-08 VITALS — BP 138/84 | HR 68 | Temp 98.3°F | Resp 16 | Ht 65.0 in | Wt 160.0 lb

## 2020-04-08 DIAGNOSIS — M25511 Pain in right shoulder: Secondary | ICD-10-CM

## 2020-04-08 DIAGNOSIS — G8929 Other chronic pain: Secondary | ICD-10-CM

## 2020-04-08 DIAGNOSIS — I10 Essential (primary) hypertension: Secondary | ICD-10-CM | POA: Diagnosis not present

## 2020-04-08 NOTE — Progress Notes (Signed)
Subjective:  Patient ID: Timothy Hudson, male    DOB: 05-10-1955  Age: 65 y.o. MRN: [id number]  CC: Hypertension and Osteoarthritis  This visit occurred during the SARS-CoV-2 public health emergency.  Safety protocols were in place, including screening questions prior to the visit, additional usage of staff PPE, and extensive cleaning of exam room while observing appropriate contact time as indicated for disinfecting solutions.    HPI Larz Mark presents for the complaint of a 64-month history of right shoulder plain.  He describes a minimal injury.  He has developed a decreased range of motion.  He is controlling the pain with ibuprofen and Tylenol.  He has been working on his lifestyle modifications and tells me his blood pressure has been well controlled.  He denies CP, DOE, palpitations, edema, or fatigue.  Outpatient Medications Prior to Visit  Medication Sig Dispense Refill  . acetaminophen (TYLENOL) 500 MG tablet Take 1,000 mg by mouth every 6 (six) hours as needed (for pain/headaches.).    Marland Kitchen esomeprazole (NEXIUM) 40 MG capsule TAKE 1 CAPSULE BY MOUTH DAILY AT 12 NOON. 90 capsule 1  . ibuprofen (ADVIL) 200 MG tablet Take 200 mg by mouth every 6 (six) hours as needed.    . Multiple Vitamin (MULTIVITAMIN WITH MINERALS) TABS tablet Take 1 tablet by mouth daily. CENTRUM SILVER 50+     No facility-administered medications prior to visit.    ROS Review of Systems  Constitutional: Negative for appetite change, diaphoresis and fatigue.  HENT: Negative.   Eyes: Negative.   Respiratory: Negative for cough, chest tightness, shortness of breath and wheezing.   Cardiovascular: Negative for chest pain, palpitations and leg swelling.  Gastrointestinal: Negative for abdominal pain, constipation, diarrhea, nausea and vomiting.  Endocrine: Negative.   Genitourinary: Negative.  Negative for difficulty urinating.  Musculoskeletal: Positive for arthralgias. Negative for back pain and  myalgias.  Skin: Negative.   Neurological: Negative.  Negative for dizziness, weakness, light-headedness and headaches.  Hematological: Negative for adenopathy. Does not bruise/bleed easily.  Psychiatric/Behavioral: Negative.     Objective:  BP 138/84   Pulse 68   Temp 98.3 F (36.8 C) (Oral)   Resp 16   Ht 5\' 5"  (1.651 m)   Wt 160 lb (72.6 kg)   SpO2 99%   BMI 26.63 kg/m   BP Readings from Last 3 Encounters:  04/08/20 138/84  10/08/19 132/82  07/02/19 (!) 156/71    Wt Readings from Last 3 Encounters:  04/08/20 160 lb (72.6 kg)  10/08/19 156 lb 4 oz (70.9 kg)  06/24/19 160 lb (72.6 kg)    Physical Exam Vitals reviewed.  HENT:     Nose: Nose normal.     Mouth/Throat:     Mouth: Mucous membranes are moist.  Eyes:     General: No scleral icterus.    Conjunctiva/sclera: Conjunctivae normal.  Cardiovascular:     Rate and Rhythm: Normal rate and regular rhythm.     Heart sounds: No murmur heard.   Pulmonary:     Effort: Pulmonary effort is normal.     Breath sounds: No stridor. No wheezing, rhonchi or rales.  Abdominal:     General: Abdomen is flat. Bowel sounds are normal. There is no distension.     Palpations: Abdomen is soft. There is no hepatomegaly, splenomegaly or mass.     Tenderness: There is no abdominal tenderness.  Musculoskeletal:     Right shoulder: No swelling, deformity, effusion, tenderness or bony tenderness. Decreased range of  motion.     Left shoulder: Normal.     Cervical back: Neck supple.     Right lower leg: No edema.     Left lower leg: No edema.  Lymphadenopathy:     Cervical: No cervical adenopathy.  Skin:    General: Skin is warm and dry.  Neurological:     General: No focal deficit present.     Mental Status: He is alert.  Psychiatric:        Mood and Affect: Mood normal.        Behavior: Behavior normal.     Lab Results  Component Value Date   WBC 4.7 04/30/2019   HGB 15.5 04/30/2019   HCT 45.3 04/30/2019   PLT 251.0  04/30/2019   GLUCOSE 87 02/27/2017   CHOL 171 04/30/2019   TRIG 86.0 04/30/2019   HDL 51.80 04/30/2019   LDLCALC 102 (H) 04/30/2019   ALT 15 02/27/2017   AST 17 02/27/2017   NA 138 02/27/2017   K 4.7 02/27/2017   CL 102 02/27/2017   CREATININE 1.03 02/27/2017   BUN 15 02/27/2017   CO2 30 02/27/2017   TSH 0.37 11/25/2014   PSA 0.49 04/30/2019    DG Shoulder Right  Result Date: 04/08/2020 CLINICAL DATA:  Pain for 7 months after injury. EXAM: RIGHT SHOULDER - 2+ VIEW COMPARISON:  None. FINDINGS: Mild degenerative changes of the acromioclavicular joint and rotator cuff insertion. No acute fracture or dislocation. Visualized portion of the right hemithorax is normal. IMPRESSION: Degenerative change, without acute osseous finding. Electronically Signed   By: Abigail Miyamoto M.D.   On: 04/08/2020 13:28    Assessment & Plan:   Douglass was seen today for hypertension and osteoarthritis.  Diagnoses and all orders for this visit:  Chronic right shoulder pain- His x-ray shows degenerative changes.  I recommended that he see sports medicine to consider treatment options. -     DG Shoulder Right; Future -     Ambulatory referral to Sports Medicine  Primary hypertension- His blood pressure is adequately well controlled with lifestyle modifications.   I am having Sharion Dove. Alferd Apa "Lanny Hurst" maintain his multivitamin with minerals, acetaminophen, ibuprofen, and esomeprazole.  No orders of the defined types were placed in this encounter.    Follow-up: Return in about 3 months (around 07/07/2020).  Scarlette Calico, MD

## 2020-04-08 NOTE — Patient Instructions (Signed)
Shoulder Pain Many things can cause shoulder pain, including:  An injury to the shoulder.  Overuse of the shoulder.  Arthritis. The source of the pain can be:  Inflammation.  An injury to the shoulder joint.  An injury to a tendon, ligament, or bone. Follow these instructions at home: Pay attention to changes in your symptoms. Let your health care provider know about them. Follow these instructions to relieve your pain. If you have a sling:  Wear the sling as told by your health care provider. Remove it only as told by your health care provider.  Loosen the sling if your fingers tingle, become numb, or turn cold and blue.  Keep the sling clean.  If the sling is not waterproof: ? Do not let it get wet. Remove it to shower or bathe.  Move your arm as little as possible, but keep your hand moving to prevent swelling. Managing pain, stiffness, and swelling  If directed, put ice on the painful area: ? Put ice in a plastic bag. ? Place a towel between your skin and the bag. ? Leave the ice on for 20 minutes, 2-3 times per day. Stop applying ice if it does not help with the pain.  Squeeze a soft ball or a foam pad as much as possible. This helps to keep the shoulder from swelling. It also helps to strengthen the arm.   General instructions  Take over-the-counter and prescription medicines only as told by your health care provider.  Keep all follow-up visits as told by your health care provider. This is important. Contact a health care provider if:  Your pain gets worse.  Your pain is not relieved with medicines.  New pain develops in your arm, hand, or fingers. Get help right away if:  Your arm, hand, or fingers: ? Tingle. ? Become numb. ? Become swollen. ? Become painful. ? Turn white or blue. Summary  Shoulder pain can be caused by an injury, overuse, or arthritis.  Pay attention to changes in your symptoms. Let your health care provider know about  them.  This condition may be treated with a sling, ice, and pain medicines.  Contact your health care provider if the pain gets worse or new pain develops. Get help right away if your arm, hand, or fingers tingle or become numb, swollen, or painful.  Keep all follow-up visits as told by your health care provider. This is important. This information is not intended to replace advice given to you by your health care provider. Make sure you discuss any questions you have with your health care provider. Document Revised: 09/25/2017 Document Reviewed: 09/25/2017 Elsevier Patient Education  2021 [employer] Inc.  

## 2020-04-09 DIAGNOSIS — I10 Essential (primary) hypertension: Secondary | ICD-10-CM | POA: Insufficient documentation

## 2020-04-13 ENCOUNTER — Encounter: Payer: Self-pay | Admitting: Internal Medicine

## 2020-04-16 NOTE — Progress Notes (Signed)
Subjective:    I'm seeing this patient as a consultation for:  Dr. Ronnald Ramp. Note will be routed back to referring provider/PCP.  CC: R shoulder pain  I, Molly Weber, LAT, ATC, am serving as scribe for Dr. Lynne Leader.  HPI: Pt is a 65 y/o male presenting w/ c/o R shoulder pain x approximately 7 months that started after falling when stepping off the back of his pickup truck and landed on his R elbow and shoulder.  He locates his pain to his R ant and post shoulder. Hx of R CTS surgery.  Radiating pain: No R shoulder mechanical symptoms: No Aggravating factors: R shoulder horizontal aBd and aDd; increased pain at night / wakes him up Treatments tried: IBU; Tylenol; ice;   Diagnostic testing: R shoulder XR- 04/08/20  Past medical history, Surgical history, Family history, Social history, Allergies, and medications have been entered into the medical record, reviewed.   Review of Systems: No new headache, visual changes, nausea, vomiting, diarrhea, constipation, dizziness, abdominal pain, skin rash, fevers, chills, night sweats, weight loss, swollen lymph nodes, body aches, joint swelling, muscle aches, chest pain, shortness of breath, mood changes, visual or auditory hallucinations.   Objective:    Vitals:   04/19/20 0748  BP: 128/78  Pulse: 67  SpO2: 98%   General: Well Developed, well nourished, and in no acute distress.  Neuro/Psych: Alert and oriented x3, extra-ocular muscles intact, able to move all 4 extremities, sensation grossly intact. Skin: Warm and dry, no rashes noted.  Respiratory: Not using accessory muscles, speaking in full sentences, trachea midline.  Cardiovascular: Pulses palpable, no extremity edema. Abdomen: Does not appear distended. MSK: C-spine normal-appearing normal motion. Right shoulder normal-appearing Mildly tender palpation AC joint. Normal shoulder motion. Intact strength abduction external and internal rotation. Mildly positive Hawkins and Neer's  test. Negative empty can test. Strongly positive crossover arm compression test. Negative Yergason's and speeds test.  Lab and Radiology Results DG Shoulder Right  Result Date: 04/08/2020 CLINICAL DATA:  Pain for 7 months after injury. EXAM: RIGHT SHOULDER - 2+ VIEW COMPARISON:  None. FINDINGS: Mild degenerative changes of the acromioclavicular joint and rotator cuff insertion. No acute fracture or dislocation. Visualized portion of the right hemithorax is normal. IMPRESSION: Degenerative change, without acute osseous finding. Electronically Signed   By: Abigail Miyamoto M.D.   On: 04/08/2020 13:28   I, Lynne Leader, personally (independently) visualized and performed the interpretation of the images attached in this note.  Diagnostic Limited MSK Ultrasound of: Right shoulder Biceps tendon intact normal. Subscapularis tendon is intact and normal. Subscapularis tendon is intact with mild subacromial bursitis. Infraspinatus tendon is intact. AC joint slightly widened with effusion. Impression: Joint effusion.  Possible first-degree AC separation history.   Impression and Recommendations:    Assessment and Plan: 65 y.o. male with right shoulder pain after fall off of a truck.  I suspect patient suffered a shoulder separation and possible supraspinatus tendon strain or injury.  His physical exam is significant for crossover arm compression test being positive which indicates pain generated around the [facility] joint.  Discussed options.  Plan for trial of physical therapy and Voltaren gel.  Check back in about 8 weeks.  If not improving would consider injection versus MRI.Marland Kitchen  PDMP not reviewed this encounter. Orders Placed This Encounter  Procedures  . [place] LIMITED JOINT SPACE STRUCTURES UP RIGHT(NO LINKED CHARGES)    Order Specific Question:   Reason for Exam (SYMPTOM  OR DIAGNOSIS REQUIRED)  Answer:   R shoulder pain    Order Specific Question:   Preferred imaging location?    Answer:   [place]  . Ambulatory referral to Physical Therapy    Referral Priority:   Routine    Referral Type:   Physical Medicine    Referral Reason:   Specialty Services Required    Requested Specialty:   Physical Therapy    Number of Visits Requested:   1   No orders of the defined types were placed in this encounter.   Discussed warning signs or symptoms. Please see discharge instructions. Patient expresses understanding.   The above documentation has been reviewed and is accurate and complete Lynne Leader, M.D.

## 2020-04-19 ENCOUNTER — Ambulatory Visit: Payer: Self-pay

## 2020-04-19 ENCOUNTER — Encounter: Payer: Self-pay | Admitting: Family Medicine

## 2020-04-19 ENCOUNTER — Ambulatory Visit: Payer: 59 | Admitting: Family Medicine

## 2020-04-19 ENCOUNTER — Other Ambulatory Visit: Payer: Self-pay

## 2020-04-19 VITALS — BP 128/78 | HR 67 | Ht 65.0 in | Wt 158.6 lb

## 2020-04-19 DIAGNOSIS — M25511 Pain in right shoulder: Secondary | ICD-10-CM | POA: Diagnosis not present

## 2020-04-19 DIAGNOSIS — G8929 Other chronic pain: Secondary | ICD-10-CM

## 2020-04-19 NOTE — Patient Instructions (Addendum)
Thank you for coming in today.  I've referred you to Physical Therapy.  Let us know if you don't hear from them in one week.  Recheck in about 8 weeks.   Let me know sooner if you are not getting bette or if you are worsening.   Please use voltaren gel up to 4x daily for pain as needed.

## 2020-07-15 ENCOUNTER — Other Ambulatory Visit: Payer: Self-pay | Admitting: Internal Medicine

## 2020-07-15 DIAGNOSIS — K219 Gastro-esophageal reflux disease without esophagitis: Secondary | ICD-10-CM

## 2020-08-03 ENCOUNTER — Ambulatory Visit: Payer: 59 | Attending: Internal Medicine

## 2020-08-03 DIAGNOSIS — Z23 Encounter for immunization: Secondary | ICD-10-CM

## 2020-08-03 NOTE — Progress Notes (Signed)
   Covid-19 Vaccination Clinic  Name:  Timothy Hudson    MRN: [id number] DOB: 14-Apr-1955  08/03/2020  [name] was observed post Covid-19 immunization for 15 minutes without incident. He was provided with Vaccine Information Sheet and instruction to access the V-Safe system.   [name] was instructed to call 911 with any severe reactions post vaccine: Marland Kitchen Difficulty breathing  . Swelling of face and throat  . A fast heartbeat  . A bad rash all over body  . Dizziness and weakness   Immunizations Administered    Name Date Dose VIS Date Route   PFIZER Comrnaty(Gray TOP) Covid-19 Vaccine 08/03/2020  1:01 PM 0.3 mL 03/04/2020 Intramuscular   Manufacturer: [place]   Lot: [id number]   NDC: [place], PharmD, MBA Clinical Acute Care Pharmacist

## 2020-08-04 ENCOUNTER — Other Ambulatory Visit: Payer: Self-pay

## 2020-08-04 MED ORDER — PFIZER-BIONT COVID-19 VAC-TRIS 30 MCG/0.3ML IM SUSP
INTRAMUSCULAR | 0 refills | Status: DC
  Filled 2020-08-04: qty 0.3, 1d supply, fill #0

## 2020-10-19 ENCOUNTER — Ambulatory Visit: Payer: 59 | Attending: Critical Care Medicine

## 2020-10-19 DIAGNOSIS — Z20822 Contact with and (suspected) exposure to covid-19: Secondary | ICD-10-CM

## 2020-10-20 LAB — SARS-COV-2, NAA 2 DAY TAT

## 2020-10-20 LAB — NOVEL CORONAVIRUS, NAA: SARS-CoV-2, NAA: NOT DETECTED

## 2021-02-23 ENCOUNTER — Other Ambulatory Visit: Payer: Self-pay | Admitting: Internal Medicine

## 2021-02-23 DIAGNOSIS — K219 Gastro-esophageal reflux disease without esophagitis: Secondary | ICD-10-CM

## 2021-03-06 IMAGING — DX DG SHOULDER 2+V*R*
3 series · 3 of 3 positions shown · non-contrast
Comparison: None.

CLINICAL DATA: Pain for 7 months after injury.

EXAM:
RIGHT SHOULDER - 2+ VIEW

[shoulder ap (1 of 2)]
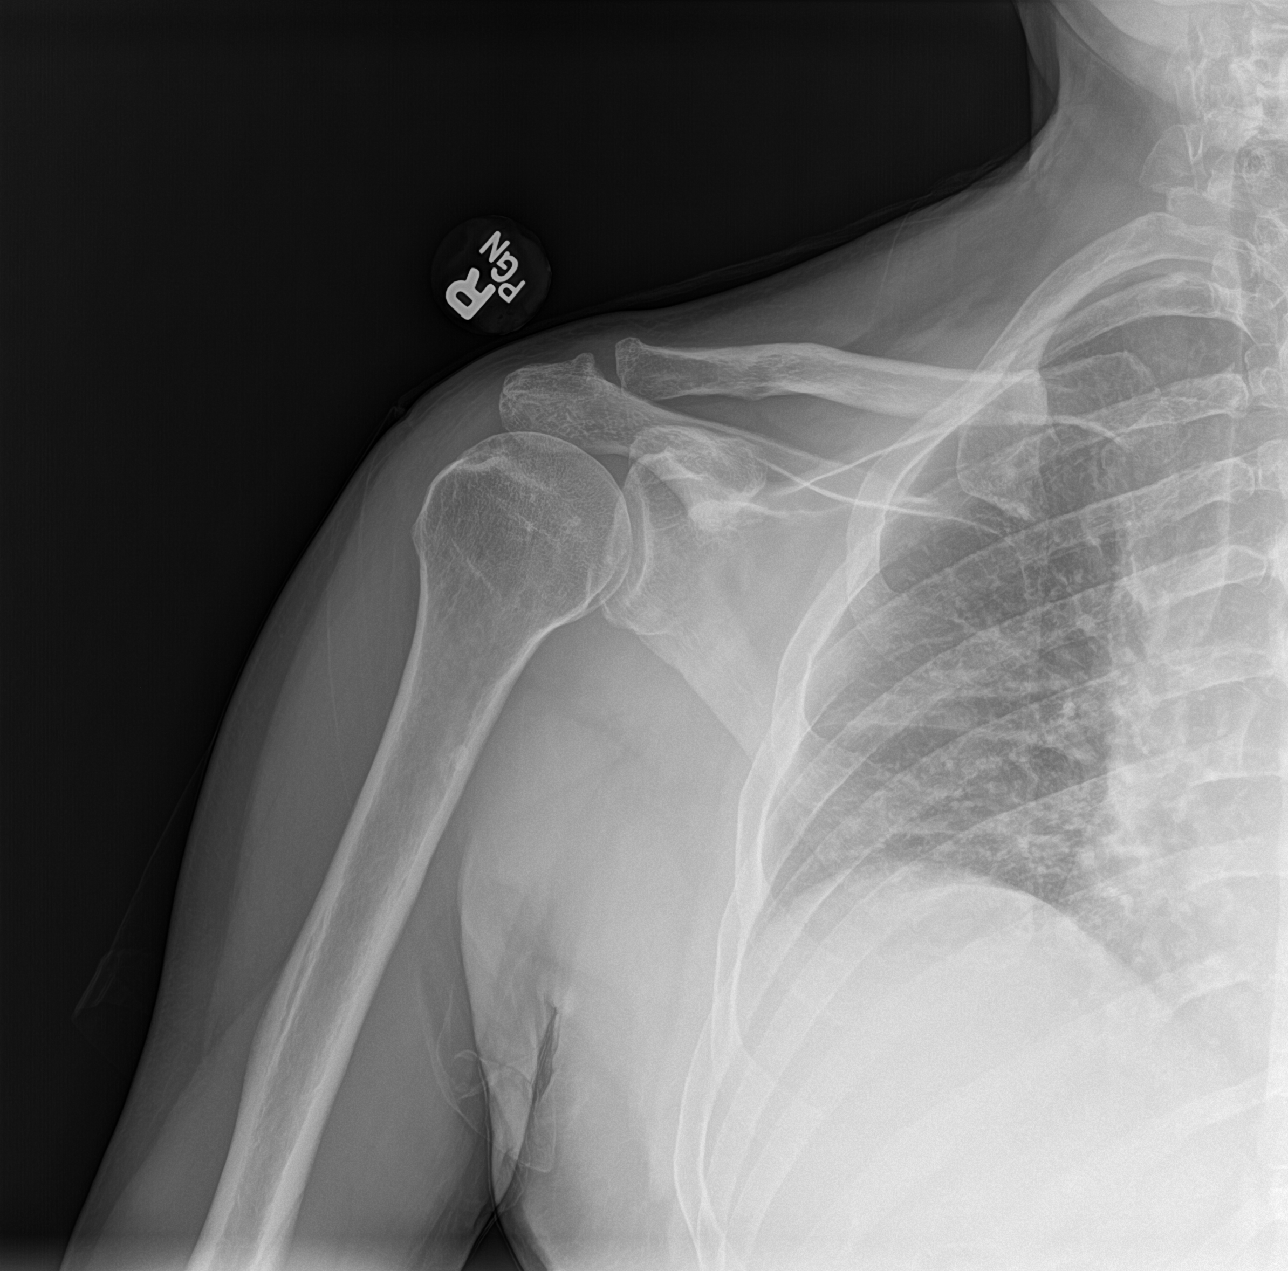

[shoulder ap (2 of 2)]
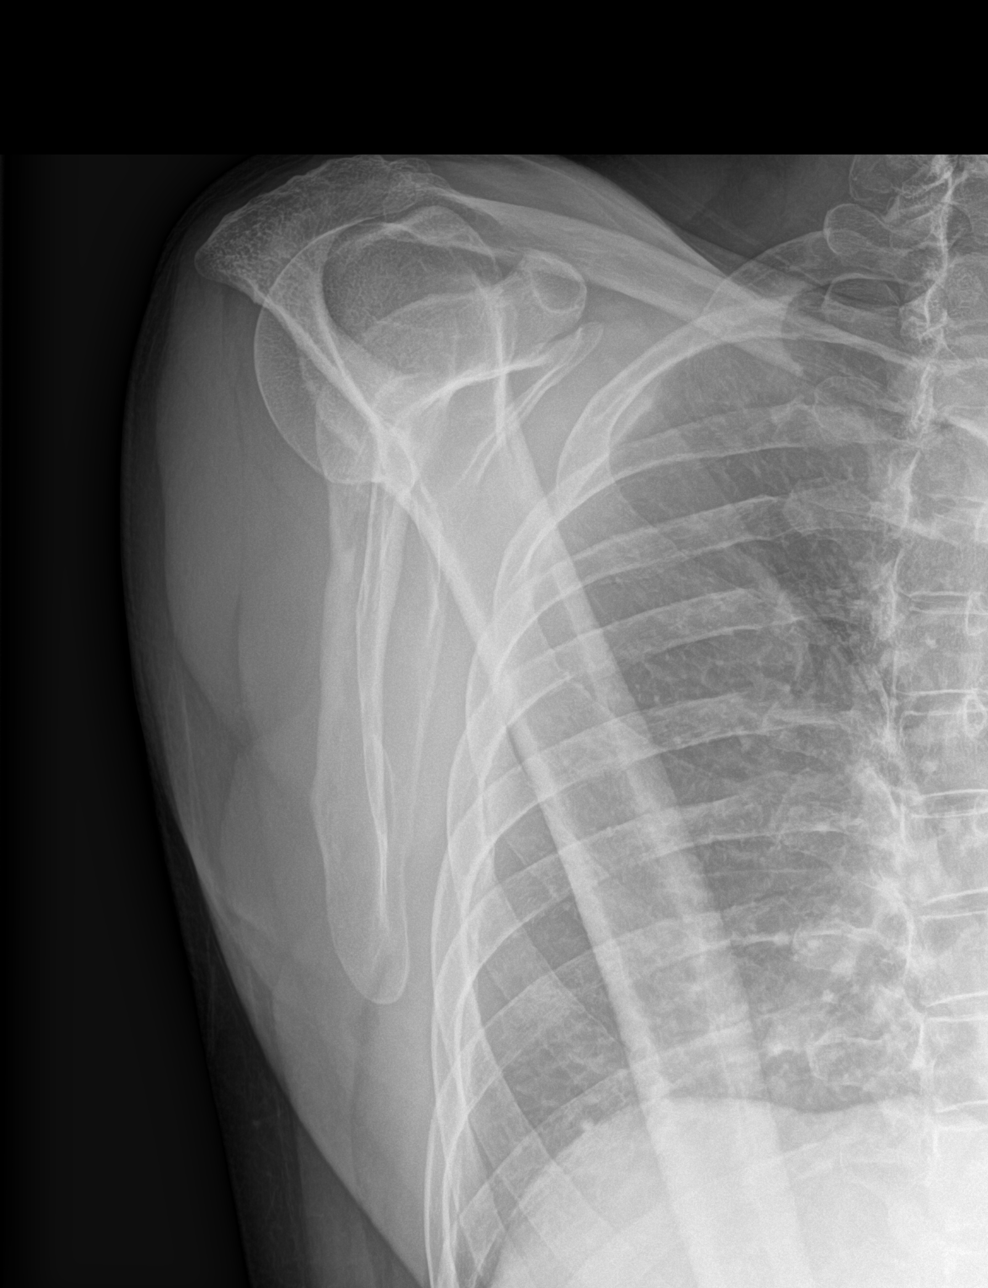

[shoulder axial]
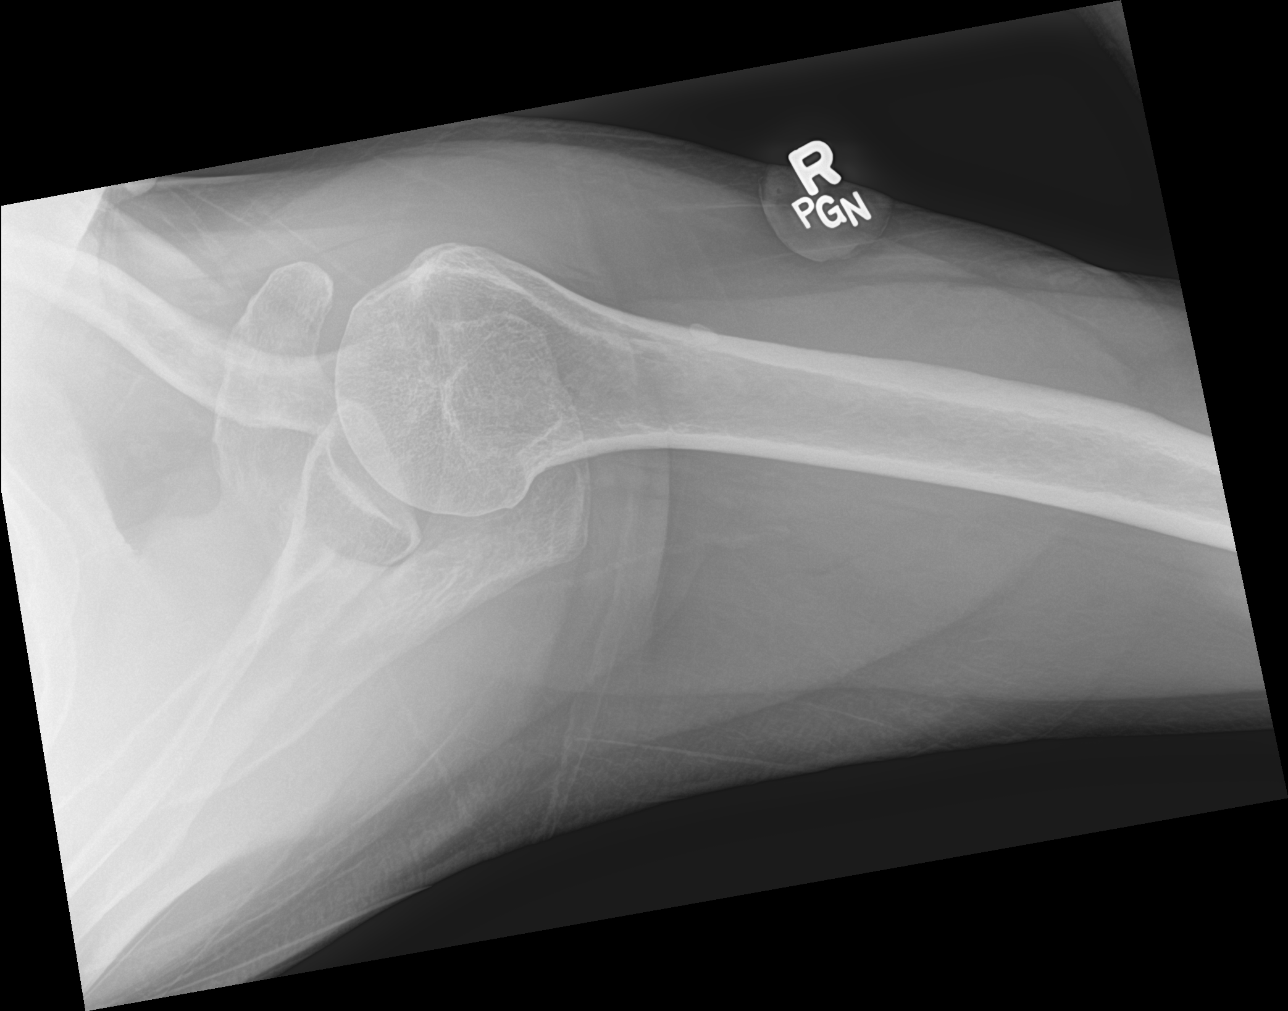

[3 of 3 positions shown; findings below may reference images not displayed]

FINDINGS: Mild degenerative changes of the acromioclavicular joint and rotator
cuff insertion. No acute fracture or dislocation. Visualized portion
of the right hemithorax is normal.
IMPRESSION: Degenerative change, without acute osseous finding.

## 2021-09-24 ENCOUNTER — Other Ambulatory Visit: Payer: Self-pay | Admitting: Internal Medicine

## 2021-09-24 DIAGNOSIS — K219 Gastro-esophageal reflux disease without esophagitis: Secondary | ICD-10-CM

## 2021-09-26 ENCOUNTER — Other Ambulatory Visit: Payer: Self-pay | Admitting: Internal Medicine

## 2021-09-26 ENCOUNTER — Telehealth: Payer: Self-pay | Admitting: Internal Medicine

## 2021-09-26 DIAGNOSIS — K219 Gastro-esophageal reflux disease without esophagitis: Secondary | ICD-10-CM

## 2021-09-26 MED ORDER — ESOMEPRAZOLE MAGNESIUM 40 MG PO CPDR: DELAYED_RELEASE_CAPSULE | ORAL | 0 refills | Status: DC

## 2021-09-26 NOTE — Telephone Encounter (Signed)
Pt called in requesting shirt supply of esomeprazole (NEXIUM) 40 MG capsule until time of scheduled appt.

## 2021-10-19 ENCOUNTER — Ambulatory Visit (INDEPENDENT_AMBULATORY_CARE_PROVIDER_SITE_OTHER): Payer: Medicare Other | Admitting: Internal Medicine

## 2021-10-19 ENCOUNTER — Encounter: Payer: Self-pay | Admitting: Internal Medicine

## 2021-10-19 VITALS — BP 144/74 | HR 65 | Temp 97.9°F | Resp 16 | Ht 65.0 in | Wt 161.0 lb

## 2021-10-19 DIAGNOSIS — N400 Enlarged prostate without lower urinary tract symptoms: Secondary | ICD-10-CM | POA: Diagnosis not present

## 2021-10-19 DIAGNOSIS — Z23 Encounter for immunization: Secondary | ICD-10-CM

## 2021-10-19 DIAGNOSIS — Z0000 Encounter for general adult medical examination without abnormal findings: Secondary | ICD-10-CM

## 2021-10-19 DIAGNOSIS — E785 Hyperlipidemia, unspecified: Secondary | ICD-10-CM | POA: Diagnosis not present

## 2021-10-19 DIAGNOSIS — K219 Gastro-esophageal reflux disease without esophagitis: Secondary | ICD-10-CM

## 2021-10-19 DIAGNOSIS — I10 Essential (primary) hypertension: Secondary | ICD-10-CM | POA: Diagnosis not present

## 2021-10-19 DIAGNOSIS — D539 Nutritional anemia, unspecified: Secondary | ICD-10-CM

## 2021-10-19 LAB — BASIC METABOLIC PANEL
BUN: 17 mg/dL (ref 6–23)
CO2: 30 mEq/L (ref 19–32)
Calcium: 8.7 mg/dL (ref 8.4–10.5)
Chloride: 103 mEq/L (ref 96–112)
Creatinine, Ser: 1.15 mg/dL (ref 0.40–1.50)
GFR: 66.24 mL/min (ref >60.00)
Glucose, Bld: 84 mg/dL (ref 70–99)
Potassium: 4.2 mEq/L (ref 3.5–5.1)
Sodium: 137 mEq/L (ref 135–145)

## 2021-10-19 LAB — URINALYSIS, ROUTINE W REFLEX MICROSCOPIC
Bilirubin Urine: NEGATIVE
Hgb urine dipstick: NEGATIVE
Ketones, ur: NEGATIVE
Leukocytes,Ua: NEGATIVE
Nitrite: NEGATIVE
RBC / HPF: NONE SEEN (ref 0–?)
Specific Gravity, Urine: <=1.005 — AB (ref 1.000–1.030)
Total Protein, Urine: NEGATIVE
Urine Glucose: NEGATIVE
Urobilinogen, UA: 0.2 (ref 0.0–1.0)
WBC, UA: NONE SEEN (ref 0–?)
pH: 7 (ref 5.0–8.0)

## 2021-10-19 LAB — CBC WITH DIFFERENTIAL/PLATELET
Basophils Absolute: 0 10*3/uL (ref 0.0–0.1)
Basophils Relative: 0.9 % (ref 0.0–3.0)
Eosinophils Absolute: 0.1 10*3/uL (ref 0.0–0.7)
Eosinophils Relative: 2.3 % (ref 0.0–5.0)
HCT: 35.9 % — ABNORMAL LOW (ref 39.0–52.0)
Hemoglobin: 11.9 g/dL — ABNORMAL LOW (ref 13.0–17.0)
Lymphocytes Relative: 31.7 % (ref 12.0–46.0)
Lymphs Abs: 1.5 10*3/uL (ref 0.7–4.0)
MCHC: 33.2 g/dL (ref 30.0–36.0)
MCV: 86.6 fl (ref 78.0–100.0)
Monocytes Absolute: 0.4 10*3/uL (ref 0.1–1.0)
Monocytes Relative: 7.9 % (ref 3.0–12.0)
Neutro Abs: 2.7 10*3/uL (ref 1.4–7.7)
Neutrophils Relative %: 57.2 % (ref 43.0–77.0)
Platelets: 253 10*3/uL (ref 150.0–400.0)
RBC: 4.15 Mil/uL — ABNORMAL LOW (ref 4.22–5.81)
RDW: 15.9 % — ABNORMAL HIGH (ref 11.5–15.5)
WBC: 4.8 10*3/uL (ref 4.0–10.5)

## 2021-10-19 LAB — HEPATIC FUNCTION PANEL
ALT: 13 U/L (ref 0–53)
AST: 17 U/L (ref 0–37)
Albumin: 4.1 g/dL (ref 3.5–5.2)
Alkaline Phosphatase: 59 U/L (ref 39–117)
Bilirubin, Direct: 0.1 mg/dL (ref 0.0–0.3)
Total Bilirubin: 0.4 mg/dL (ref 0.2–1.2)
Total Protein: 6.9 g/dL (ref 6.0–8.3)

## 2021-10-19 LAB — LIPID PANEL
Cholesterol: 173 mg/dL (ref 0–200)
HDL: 47.9 mg/dL (ref >39.00)
LDL Cholesterol: 104 mg/dL — ABNORMAL HIGH (ref 0–99)
NonHDL: 125.24
Total CHOL/HDL Ratio: 4
Triglycerides: 108 mg/dL (ref 0.0–149.0)
VLDL: 21.6 mg/dL (ref 0.0–40.0)

## 2021-10-19 LAB — PSA: PSA: 0.71 ng/mL (ref 0.10–4.00)

## 2021-10-19 LAB — TSH: TSH: 1.97 u[IU]/mL (ref 0.35–5.50)

## 2021-10-19 MED ORDER — ESOMEPRAZOLE MAGNESIUM 40 MG PO CPDR: 40.0000 mg | DELAYED_RELEASE_CAPSULE | Freq: Every day | ORAL | 1 refills | Status: DC

## 2021-10-19 NOTE — Patient Instructions (Signed)
Health Maintenance, Male Adopting a healthy lifestyle and getting preventive care are important in promoting health and wellness. Ask your health care provider about: The right schedule for you to have regular tests and exams. Things you can do on your own to prevent diseases and keep yourself healthy. What should I know about diet, weight, and exercise? Eat a healthy diet  Eat a diet that includes plenty of vegetables, fruits, low-fat dairy products, and lean protein. Do not eat a lot of foods that are high in solid fats, added sugars, or sodium. Maintain a healthy weight Body mass index (BMI) is a measurement that can be used to identify possible weight problems. It estimates body fat based on height and weight. Your health care provider can help determine your BMI and help you achieve or maintain a healthy weight. Get regular exercise Get regular exercise. This is one of the most important things you can do for your health. Most adults should: Exercise for at least 150 minutes each week. The exercise should increase your heart rate and make you sweat (moderate-intensity exercise). Do strengthening exercises at least twice a week. This is in addition to the moderate-intensity exercise. Spend less time sitting. Even light physical activity can be beneficial. Watch cholesterol and blood lipids Have your blood tested for lipids and cholesterol at 67 years of age, then have this test every 5 years. You may need to have your cholesterol levels checked more often if: Your lipid or cholesterol levels are high. You are older than 67 years of age. You are at high risk for heart disease. What should I know about cancer screening? Many types of cancers can be detected early and may often be prevented. Depending on your health history and family history, you may need to have cancer screening at various ages. This may include screening for: Colorectal cancer. Prostate cancer. Skin cancer. Lung  cancer. What should I know about heart disease, diabetes, and high blood pressure? Blood pressure and heart disease High blood pressure causes heart disease and increases the risk of stroke. This is more likely to develop in people who have high blood pressure readings or are overweight. Talk with your health care provider about your target blood pressure readings. Have your blood pressure checked: Every 3-5 years if you are 18-39 years of age. Every year if you are 40 years old or older. If you are between the ages of 65 and 75 and are a current or former smoker, ask your health care provider if you should have a one-time screening for abdominal aortic aneurysm (AAA). Diabetes Have regular diabetes screenings. This checks your fasting blood sugar level. Have the screening done: Once every three years after age 45 if you are at a normal weight and have a low risk for diabetes. More often and at a younger age if you are overweight or have a high risk for diabetes. What should I know about preventing infection? Hepatitis B If you have a higher risk for hepatitis B, you should be screened for this virus. Talk with your health care provider to find out if you are at risk for hepatitis B infection. Hepatitis C Blood testing is recommended for: Everyone born from 1945 through 1965. Anyone with known risk factors for hepatitis C. Sexually transmitted infections (STIs) You should be screened each year for STIs, including gonorrhea and chlamydia, if: You are sexually active and are younger than 67 years of age. You are older than 67 years of age and your   health care provider tells you that you are at risk for this type of infection. Your sexual activity has changed since you were last screened, and you are at increased risk for chlamydia or gonorrhea. Ask your health care provider if you are at risk. Ask your health care provider about whether you are at high risk for HIV. Your health care provider  may recommend a prescription medicine to help prevent HIV infection. If you choose to take medicine to prevent HIV, you should first get tested for HIV. You should then be tested every 3 months for as long as you are taking the medicine. Follow these instructions at home: Alcohol use Do not drink alcohol if your health care provider tells you not to drink. If you drink alcohol: Limit how much you have to 0-2 drinks a day. Know how much alcohol is in your drink. In the [place] one drink equals one 12 oz bottle of beer (355 mL), one 5 oz glass of wine (148 mL), or one 1 oz glass of hard liquor (44 mL). Lifestyle Do not use any products that contain nicotine or tobacco. These products include cigarettes, chewing tobacco, and vaping devices, such as e-cigarettes. If you need help quitting, ask your health care provider. Do not use street drugs. Do not share needles. Ask your health care provider for help if you need support or information about quitting drugs. General instructions Schedule regular health, dental, and eye exams. Stay current with your vaccines. Tell your health care provider if: You often feel depressed. You have ever been abused or do not feel safe at home. Summary Adopting a healthy lifestyle and getting preventive care are important in promoting health and wellness. Follow your health care provider's instructions about healthy diet, exercising, and getting tested or screened for diseases. Follow your health care provider's instructions on monitoring your cholesterol and blood pressure. This information is not intended to replace advice given to you by your health care provider. Make sure you discuss any questions you have with your health care provider. Document Revised: 08/02/2020 Document Reviewed: 08/02/2020 Elsevier Patient Education  2023 Elsevier Inc.  

## 2021-10-19 NOTE — Progress Notes (Signed)
Subjective:  Patient ID: Timothy Hudson, male    DOB: Sep 02, 1955  Age: 66 y.o. MRN: [id number]  CC: Annual Exam, Hypertension, Hyperlipidemia, Gastroesophageal Reflux, and Anemia   HPI Timothy Hudson presents for a CPX and f/up -   He recently tried to donate blood but was told that he is anemic and the blood could not be accepted.  He is very active and denies chest pain, shortness of breath, diaphoresis, dizziness, lightheadedness, or paresthesias.  Outpatient Medications Prior to Visit  Medication Sig Dispense Refill   acetaminophen (TYLENOL) 500 MG tablet Take 1,000 mg by mouth every 6 (six) hours as needed (for pain/headaches.).     ibuprofen (ADVIL) 200 MG tablet Take 200 mg by mouth every 6 (six) hours as needed.     Multiple Vitamin (MULTIVITAMIN WITH MINERALS) TABS tablet Take 1 tablet by mouth daily. CENTRUM SILVER 50+     COVID-19 mRNA Vac-TriS, Pfizer, (PFIZER-BIONT COVID-19 VAC-TRIS) SUSP injection Inject into the muscle. 0.3 mL 0   esomeprazole (NEXIUM) 40 MG capsule TAKE 1 CAPSULE BY MOUTH DAILY AT 12 NOON. 30 capsule 0   No facility-administered medications prior to visit.    ROS Review of Systems  Constitutional: Negative.  Negative for diaphoresis, fatigue and unexpected weight change.  HENT: Negative.  Negative for trouble swallowing and voice change.   Eyes: Negative.   Respiratory: Negative.  Negative for cough, chest tightness, shortness of breath and wheezing.   Cardiovascular:  Negative for chest pain, palpitations and leg swelling.  Gastrointestinal:  Negative for abdominal pain, blood in stool, constipation, diarrhea, nausea and vomiting.  Endocrine: Negative.   Genitourinary: Negative.  Negative for difficulty urinating.  Musculoskeletal: Negative.  Negative for arthralgias, back pain, myalgias and neck pain.  Skin: Negative.  Negative for color change and pallor.  Neurological:  Negative for dizziness, weakness, light-headedness, numbness and  headaches.  Hematological:  Negative for adenopathy. Does not bruise/bleed easily.  Psychiatric/Behavioral: Negative.      Objective:  BP (!) 144/74 (BP Location: Right Arm, Patient Position: Sitting, Cuff Size: Large)   Pulse 65   Temp 97.9 F (36.6 C) (Oral)   Resp 16   Ht '5\' 5"'$  (1.651 m)   Wt 161 lb (73 kg)   SpO2 99%   BMI 26.79 kg/m   BP Readings from Last 3 Encounters:  10/19/21 (!) 144/74  04/19/20 128/78  04/08/20 138/84    Wt Readings from Last 3 Encounters:  10/19/21 161 lb (73 kg)  04/19/20 158 lb 9.6 oz (71.9 kg)  04/08/20 160 lb (72.6 kg)    Physical Exam Vitals reviewed.  HENT:     Nose: Nose normal.     Mouth/Throat:     Mouth: Mucous membranes are moist.  Eyes:     General: No scleral icterus.    Conjunctiva/sclera: Conjunctivae normal.  Cardiovascular:     Rate and Rhythm: Regular rhythm. Bradycardia present.     Heart sounds: Normal heart sounds, S1 normal and S2 normal. No murmur heard.    Comments: EKG- SB, 59 bpm No LVH or Q waves Pulmonary:     Effort: Pulmonary effort is normal.     Breath sounds: No stridor. No wheezing, rhonchi or rales.  Abdominal:     General: Abdomen is flat.     Palpations: There is no mass.     Tenderness: There is no abdominal tenderness. There is no guarding or rebound.     Hernia: No hernia is present. There is  no hernia in the left inguinal area or right inguinal area.  Genitourinary:    Pubic Area: No rash.      Penis: Normal and circumcised.      Testes: Normal.     Epididymis:     Right: Normal.     Left: Normal.     Prostate: Enlarged. Not tender and no nodules present.     Rectum: Normal. Guaiac result negative. No mass, tenderness, anal fissure, external hemorrhoid or internal hemorrhoid. Normal anal tone.  Musculoskeletal:        General: Normal range of motion.     Cervical back: Neck supple.     Right lower leg: No edema.     Left lower leg: No edema.  Lymphadenopathy:     Cervical: No  cervical adenopathy.     Lower Body: No right inguinal adenopathy. No left inguinal adenopathy.  Skin:    General: Skin is warm and dry.     Coloration: Skin is not pale.     Findings: No rash.  Neurological:     General: No focal deficit present.     Mental Status: He is alert.  Psychiatric:        Mood and Affect: Mood normal.        Behavior: Behavior normal.     Lab Results  Component Value Date   WBC 4.8 10/19/2021   HGB 11.9 (L) 10/19/2021   HCT 35.9 (L) 10/19/2021   PLT 253.0 10/19/2021   GLUCOSE 84 10/19/2021   CHOL 173 10/19/2021   TRIG 108.0 10/19/2021   HDL 47.90 10/19/2021   LDLCALC 104 (H) 10/19/2021   ALT 13 10/19/2021   AST 17 10/19/2021   NA 137 10/19/2021   K 4.2 10/19/2021   CL 103 10/19/2021   CREATININE 1.15 10/19/2021   BUN 17 10/19/2021   CO2 30 10/19/2021   TSH 1.97 10/19/2021   PSA 0.71 10/19/2021    No results found.  Assessment & Plan:   Rachit was seen today for annual exam, hypertension, hyperlipidemia, gastroesophageal reflux and anemia.  Diagnoses and all orders for this visit:  Primary hypertension-his blood pressure is well controlled with lifestyle modifications.  His EKG is reassuring. -     Basic metabolic panel; Future -     CBC with Differential/Platelet; Future -     TSH; Future -     Urinalysis, Routine w reflex microscopic; Future -     Hepatic function panel; Future -     EKG 12-Lead -     Hepatic function panel -     Urinalysis, Routine w reflex microscopic -     TSH -     CBC with Differential/Platelet -     Basic metabolic panel  GERD without esophagitis-his symptoms are well controlled. -     esomeprazole (NEXIUM) 40 MG capsule; Take 1 capsule (40 mg total) by mouth daily at 12 noon. -     CBC with Differential/Platelet; Future -     Hepatic function panel; Future -     Hepatic function panel -     CBC with Differential/Platelet  Routine general medical examination at a health care facility-exam completed,  labs reviewed, vaccines reviewed and updated, cancer screenings are up-to-date, patient education was given.  Hyperlipidemia LDL goal <130-statin is not indicated. -     Lipid panel; Future -     TSH; Future -     Hepatic function panel; Future -     Hepatic  function panel -     TSH -     Lipid panel  Benign prostatic hyperplasia without lower urinary tract symptoms-his PSA is normal. 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 -     IBC + Ferritin; Future -     Vitamin B1; Future -     Zinc; Future -     Folate; Future -     Vitamin B12; Future -     Reticulocytes; Future   I have discontinued Sharion Dove. Lacko "Keith"'s Pfizer-BioNT COVID-19 Vac-TriS. I have also changed his esomeprazole. Additionally, I am having him maintain his multivitamin with minerals, acetaminophen, and ibuprofen.  Meds ordered this encounter  Medications   esomeprazole (NEXIUM) 40 MG capsule    Sig: Take 1 capsule (40 mg total) by mouth daily at 12 noon.    Dispense:  90 capsule    Refill:  1     Follow-up: Return in about 6 months (around 04/21/2022).  Scarlette Calico, MD

## 2021-10-24 ENCOUNTER — Encounter: Payer: Self-pay | Admitting: Internal Medicine

## 2021-10-24 DIAGNOSIS — Z23 Encounter for immunization: Secondary | ICD-10-CM | POA: Diagnosis not present

## 2021-10-24 DIAGNOSIS — Z0000 Encounter for general adult medical examination without abnormal findings: Secondary | ICD-10-CM | POA: Diagnosis not present

## 2021-10-24 DIAGNOSIS — K219 Gastro-esophageal reflux disease without esophagitis: Secondary | ICD-10-CM | POA: Diagnosis not present

## 2021-10-24 DIAGNOSIS — I10 Essential (primary) hypertension: Secondary | ICD-10-CM | POA: Diagnosis not present

## 2021-10-25 ENCOUNTER — Other Ambulatory Visit: Payer: Self-pay | Admitting: Internal Medicine

## 2021-10-25 ENCOUNTER — Other Ambulatory Visit (INDEPENDENT_AMBULATORY_CARE_PROVIDER_SITE_OTHER): Payer: Medicare Other

## 2021-10-25 DIAGNOSIS — D508 Other iron deficiency anemias: Secondary | ICD-10-CM

## 2021-10-25 DIAGNOSIS — D539 Nutritional anemia, unspecified: Secondary | ICD-10-CM | POA: Diagnosis not present

## 2021-10-25 DIAGNOSIS — D649 Anemia, unspecified: Secondary | ICD-10-CM | POA: Insufficient documentation

## 2021-10-25 DIAGNOSIS — E538 Deficiency of other specified B group vitamins: Secondary | ICD-10-CM

## 2021-10-25 LAB — IBC + FERRITIN
Ferritin: 6.4 ng/mL — ABNORMAL LOW (ref 22.0–322.0)
Iron: 60 ug/dL (ref 42–165)
Saturation Ratios: 12.5 % — ABNORMAL LOW (ref 20.0–50.0)
TIBC: 481.6 ug/dL — ABNORMAL HIGH (ref 250.0–450.0)
Transferrin: 344 mg/dL (ref 212.0–360.0)

## 2021-10-25 LAB — VITAMIN B12: Vitamin B-12: 176 pg/mL — ABNORMAL LOW (ref 211–911)

## 2021-10-25 LAB — FOLATE: Folate: 8.9 ng/mL (ref >5.9)

## 2021-10-25 MED ORDER — ACCRUFER 30 MG PO CAPS: 1.0000 | ORAL_CAPSULE | Freq: Two times a day (BID) | ORAL | 1 refills | Status: DC

## 2021-10-26 ENCOUNTER — Ambulatory Visit (INDEPENDENT_AMBULATORY_CARE_PROVIDER_SITE_OTHER): Payer: Medicare Other

## 2021-10-26 DIAGNOSIS — E538 Deficiency of other specified B group vitamins: Secondary | ICD-10-CM

## 2021-10-26 MED ORDER — CYANOCOBALAMIN 1000 MCG/ML IJ SOLN
1000.0000 ug | Freq: Once | INTRAMUSCULAR | Status: AC
  Administered 2021-10-26: 1000 ug via INTRAMUSCULAR

## 2021-10-26 NOTE — Progress Notes (Signed)
After obtaining consent, and per orders of Dr. Ronnald Ramp, injection of B12 given on the left deltoid by Marrian Salvage. Patient tolerated well, instructed to report any adverse reaction to me immediately.

## 2021-10-28 LAB — RETICULOCYTES
ABS Retic: 52080 cells/uL (ref 25000–90000)
Retic Ct Pct: 1.2 %

## 2021-10-28 LAB — ZINC: Zinc: 76 ug/dL (ref 60–130)

## 2021-10-28 LAB — VITAMIN B1: Vitamin B1 (Thiamine): 14 nmol/L (ref 8–30)

## 2021-12-01 ENCOUNTER — Ambulatory Visit (INDEPENDENT_AMBULATORY_CARE_PROVIDER_SITE_OTHER): Payer: Medicare Other | Admitting: *Deleted

## 2021-12-01 DIAGNOSIS — E538 Deficiency of other specified B group vitamins: Secondary | ICD-10-CM | POA: Diagnosis not present

## 2021-12-01 MED ORDER — CYANOCOBALAMIN 1000 MCG/ML IJ SOLN
1000.0000 ug | Freq: Once | INTRAMUSCULAR | Status: AC
  Administered 2021-12-01: 1000 ug via INTRAMUSCULAR

## 2021-12-01 NOTE — Progress Notes (Signed)
Pls cosign for B12 inj../lmb  

## 2022-01-03 ENCOUNTER — Ambulatory Visit (INDEPENDENT_AMBULATORY_CARE_PROVIDER_SITE_OTHER): Payer: Medicare Other | Admitting: *Deleted

## 2022-01-03 DIAGNOSIS — E538 Deficiency of other specified B group vitamins: Secondary | ICD-10-CM | POA: Diagnosis not present

## 2022-01-03 MED ORDER — CYANOCOBALAMIN 1000 MCG/ML IJ SOLN
1000.0000 ug | Freq: Once | INTRAMUSCULAR | Status: AC
  Administered 2022-01-03: 1000 ug via INTRAMUSCULAR

## 2022-01-03 NOTE — Progress Notes (Addendum)
Administered B12 shot left deltoid. Pt tolerated well. 

## 2022-01-20 DIAGNOSIS — D485 Neoplasm of uncertain behavior of skin: Secondary | ICD-10-CM | POA: Diagnosis not present

## 2022-01-20 DIAGNOSIS — L570 Actinic keratosis: Secondary | ICD-10-CM | POA: Diagnosis not present

## 2022-02-03 ENCOUNTER — Ambulatory Visit (INDEPENDENT_AMBULATORY_CARE_PROVIDER_SITE_OTHER): Payer: Medicare Other

## 2022-02-03 DIAGNOSIS — E538 Deficiency of other specified B group vitamins: Secondary | ICD-10-CM

## 2022-02-03 MED ORDER — CYANOCOBALAMIN 1000 MCG/ML IJ SOLN
1000.0000 ug | Freq: Once | INTRAMUSCULAR | Status: AC
  Administered 2022-02-03: 1000 ug via INTRAMUSCULAR

## 2022-02-03 NOTE — Progress Notes (Signed)
B12 given.  Pt tolerated well. Pt is aware to give the office a call for an side effects or reactions. Please co-sign.   

## 2022-02-07 DIAGNOSIS — D044 Carcinoma in situ of skin of scalp and neck: Secondary | ICD-10-CM | POA: Diagnosis not present

## 2022-03-08 ENCOUNTER — Ambulatory Visit (INDEPENDENT_AMBULATORY_CARE_PROVIDER_SITE_OTHER): Payer: Medicare Other

## 2022-03-08 DIAGNOSIS — E538 Deficiency of other specified B group vitamins: Secondary | ICD-10-CM

## 2022-03-08 MED ORDER — CYANOCOBALAMIN 1000 MCG/ML IJ SOLN
1000.0000 ug | Freq: Once | INTRAMUSCULAR | Status: AC
  Administered 2022-03-08: 1000 ug via INTRAMUSCULAR

## 2022-03-08 NOTE — Progress Notes (Signed)
Pt was given F16 w/o any complications.  Per provider pt is to come back every 2 months for the next year for a b12 injection pts 1st 58monthinjection is scheduled for 05/08/2022.

## 2022-03-26 ENCOUNTER — Telehealth: Payer: Medicare Other | Admitting: Nurse Practitioner

## 2022-03-26 DIAGNOSIS — U071 COVID-19: Secondary | ICD-10-CM | POA: Diagnosis not present

## 2022-03-26 MED ORDER — NIRMATRELVIR/RITONAVIR (PAXLOVID)TABLET: 3.0000 | ORAL_TABLET | Freq: Two times a day (BID) | ORAL | 0 refills | Status: AC

## 2022-03-26 NOTE — Patient Instructions (Signed)
Timothy Hudson, thank you for joining Timothy Pounds, NP for today's virtual visit.  While this provider is not your primary care provider (PCP), if your PCP is located in our provider database this encounter information will be shared with them immediately following your visit.   [facility] account gives you access to today's visit and all your visits, tests, and labs performed at [facility] " click here if you don't have a [facility] account or go to [email or web address]  Consent: (Patient) Timothy Hudson provided verbal consent for this virtual visit at the beginning of the encounter.  Current Medications:  Current Outpatient Medications:    nirmatrelvir/ritonavir (PAXLOVID) 20 x 150 MG & 10 x '100MG'$  TABS, Take 3 tablets by mouth 2 (two) times daily for 5 days. (Take nirmatrelvir 150 mg two tablets twice daily for 5 days and ritonavir 100 mg one tablet twice daily for 5 days) Patient GFR is 66, Disp: 30 tablet, Rfl: 0   acetaminophen (TYLENOL) 500 MG tablet, Take 1,000 mg by mouth every 6 (six) hours as needed (for pain/headaches.)., Disp: , Rfl:    esomeprazole (NEXIUM) 40 MG capsule, Take 1 capsule (40 mg total) by mouth daily at 12 noon., Disp: 90 capsule, Rfl: 1   ibuprofen (ADVIL) 200 MG tablet, Take 200 mg by mouth every 6 (six) hours as needed., Disp: , Rfl:    Multiple Vitamin (MULTIVITAMIN WITH MINERALS) TABS tablet, Take 1 tablet by mouth daily. CENTRUM SILVER 50+, Disp: , Rfl:    Medications ordered in this encounter:  Meds ordered this encounter  Medications   nirmatrelvir/ritonavir (PAXLOVID) 20 x 150 MG & 10 x '100MG'$  TABS    Sig: Take 3 tablets by mouth 2 (two) times daily for 5 days. (Take nirmatrelvir 150 mg two tablets twice daily for 5 days and ritonavir 100 mg one tablet twice daily for 5 days) Patient GFR is 66    Dispense:  30 tablet    Refill:  0    Order Specific Question:   Supervising Provider    Answer:   Chase Picket [id number]     *If you need refills on other medications prior to your next appointment, please contact your pharmacy*  Follow-Up: Call back or seek an in-person evaluation if the symptoms worsen or if the condition fails to improve as anticipated.  [facility] [phone]  Other Instructions  Please keep well-hydrated and get plenty of rest. Start a saline nasal rinse to flush out your nasal passages. You can use plain Mucinex to help thin congestion. If you have a humidifier, you can use this daily as needed.    You are to wear a mask for 5 days from onset of your symptoms.  After day 5, if you have had no fever and you are feeling better with NO symptoms, you can end masking. Keep in mind you can be contagious 10 days from the onset of symptoms  After day 5 if you have a fever or are having significant symptoms, please wear your mask for full 10 days.   If you note any worsening of symptoms, any significant shortness of breath or any chest pain, please seek ER evaluation ASAP.  Please do not delay care!    If you note any worsening of symptoms, any significant shortness of breath or any chest pain, please seek ER evaluation ASAP.  Please do not delay care!    If you have been instructed  to have an in-person evaluation today at a local Urgent Care facility, please use the link below. It will take you to a list of all of our available [facility], including address, phone number and hours of operation. Please do not delay care.  [facility]  If you or a family member do not have a primary care provider, use the link below to schedule a visit and establish care. When you choose a [facility] primary care physician or advanced practice provider, you gain a long-term partner in health. Find a Primary Care Provider  Learn more about [facility] in-office and virtual care options: [facility]

## 2022-03-26 NOTE — Progress Notes (Signed)
Virtual Visit Consent   Timothy Hudson, you are scheduled for a virtual visit with a [facility] provider today. Just as with appointments in the office, your consent must be obtained to participate. Your consent will be active for this visit and any virtual visit you may have with one of our providers in the next 365 days. If you have a MyChart account, a copy of this consent can be sent to you electronically.  As this is a virtual visit, video technology does not allow for your provider to perform a traditional examination. This may limit your provider's ability to fully assess your condition. If your provider identifies any concerns that need to be evaluated in person or the need to arrange testing (such as labs, EKG, etc.), we will make arrangements to do so. Although advances in technology are sophisticated, we cannot ensure that it will always work on either your end or our end. If the connection with a video visit is poor, the visit may have to be switched to a telephone visit. With either a video or telephone visit, we are not always able to ensure that we have a secure connection.  By engaging in this virtual visit, you consent to the provision of healthcare and authorize for your insurance to be billed (if applicable) for the services provided during this visit. Depending on your insurance coverage, you may receive a charge related to this service.  I need to obtain your verbal consent now. Are you willing to proceed with your visit today? Timothy Hudson has provided verbal consent on 03/26/2022 for a virtual visit (video or telephone). Timothy Pounds, NP  Date: 03/26/2022 12:22 PM  Virtual Visit via Video Note   I, Timothy Hudson, connected with  Timothy Hudson  ([id number], [birth year]) on 03/26/22 at 12:15 PM EST by a video-enabled telemedicine application and verified that I am speaking with the correct person using two identifiers.  Location: Patient: Virtual Visit  Location Patient: Home Provider: Virtual Visit Location Provider: Home Office   I discussed the limitations of evaluation and management by telemedicine and the availability of in person appointments. The patient expressed understanding and agreed to proceed.    History of Present Illness: Timothy Hudson is a 66 y.o. who identifies as a male who was assigned male at birth, and is being seen today for COVID POSITIVE symptoms.  Timothy Hudson tested positive for COVID via home test this morning. Notes fever with tmax 100.1, fatigue, myalgias, rhinorrhea, cough, congestion, headache. Symptoms onset was over the past 24 hours  Problems:  Patient Active Problem List   Diagnosis Date Noted   Absolute anemia 10/25/2021   B12 deficiency 10/25/2021   Hyperlipidemia LDL goal <130 10/19/2021   Benign prostatic hyperplasia without lower urinary tract symptoms 10/19/2021   Deficiency anemia 10/19/2021   Need for vaccination 10/19/2021   Primary hypertension 04/09/2020   GERD without esophagitis 04/30/2019   BRBPR (bright red blood per rectum) 04/30/2019   Routine general medical examination at a health care facility 11/25/2014   History of colonic polyps 11/25/2014    Allergies:  Allergies  Allergen Reactions   Mobic [Meloxicam] Other (See Comments)    Blisters in mouth. Pt takes ibuprofen at home.   Prednisone Other (See Comments)    INSOMNIA (HYPERACTIVITY)/MOODS CHANGES   Versed [Midazolam] Anxiety and Other (See Comments)   Medications:  Current Outpatient Medications:    nirmatrelvir/ritonavir (PAXLOVID) 20 x 150 MG & 10 x '100MG'$  TABS,  Take 3 tablets by mouth 2 (two) times daily for 5 days. (Take nirmatrelvir 150 mg two tablets twice daily for 5 days and ritonavir 100 mg one tablet twice daily for 5 days) Patient GFR is 66, Disp: 30 tablet, Rfl: 0   acetaminophen (TYLENOL) 500 MG tablet, Take 1,000 mg by mouth every 6 (six) hours as needed (for pain/headaches.)., Disp: , Rfl:     esomeprazole (NEXIUM) 40 MG capsule, Take 1 capsule (40 mg total) by mouth daily at 12 noon., Disp: 90 capsule, Rfl: 1   ibuprofen (ADVIL) 200 MG tablet, Take 200 mg by mouth every 6 (six) hours as needed., Disp: , Rfl:    Multiple Vitamin (MULTIVITAMIN WITH MINERALS) TABS tablet, Take 1 tablet by mouth daily. CENTRUM SILVER 50+, Disp: , Rfl:   Observations/Objective: Patient is well-developed, well-nourished in no acute distress.  Resting comfortably at home.  Head is normocephalic, atraumatic.  No labored breathing.  Speech is clear and coherent with logical content.  Patient is alert and oriented at baseline.    Assessment and Plan: 1. Positive self-administered antigen test for COVID-19 - nirmatrelvir/ritonavir (PAXLOVID) 20 x 150 MG & 10 x '100MG'$  TABS; Take 3 tablets by mouth 2 (two) times daily for 5 days. (Take nirmatrelvir 150 mg two tablets twice daily for 5 days and ritonavir 100 mg one tablet twice daily for 5 days) Patient GFR is 66  Dispense: 30 tablet; Refill: 0   Please keep well-hydrated and get plenty of rest. Start a saline nasal rinse to flush out your nasal passages. You can use plain Mucinex to help thin congestion. If you have a humidifier, you can use this daily as needed.    You are to wear a mask for 5 days from onset of your symptoms.  After day 5, if you have had no fever and you are feeling better with NO symptoms, you can end masking. Keep in mind you can be contagious 10 days from the onset of symptoms  After day 5 if you have a fever or are having significant symptoms, please wear your mask for full 10 days.   If you note any worsening of symptoms, any significant shortness of breath or any chest pain, please seek ER evaluation ASAP.  Please do not delay care!    If you note any worsening of symptoms, any significant shortness of breath or any chest pain, please seek ER evaluation ASAP.  Please do not delay care!   Follow Up Instructions: I discussed the  assessment and treatment plan with the patient. The patient was provided an opportunity to ask questions and all were answered. The patient agreed with the plan and demonstrated an understanding of the instructions.  A copy of instructions were sent to the patient via MyChart unless otherwise noted below.     The patient was advised to call back or seek an in-person evaluation if the symptoms worsen or if the condition fails to improve as anticipated.  Time:  I spent 12 minutes with the patient via telehealth technology discussing the above problems/concerns.    Timothy Pounds, NP

## 2022-05-04 ENCOUNTER — Ambulatory Visit: Payer: Medicare Other

## 2022-05-08 ENCOUNTER — Ambulatory Visit: Payer: Medicare Other

## 2022-05-11 ENCOUNTER — Ambulatory Visit (INDEPENDENT_AMBULATORY_CARE_PROVIDER_SITE_OTHER): Payer: Medicare Other

## 2022-05-11 DIAGNOSIS — Z23 Encounter for immunization: Secondary | ICD-10-CM

## 2022-05-11 DIAGNOSIS — E538 Deficiency of other specified B group vitamins: Secondary | ICD-10-CM

## 2022-05-11 MED ORDER — CYANOCOBALAMIN 1000 MCG/ML IJ SOLN
1000.0000 ug | Freq: Once | INTRAMUSCULAR | Status: AC
  Administered 2022-05-11: 1000 ug via INTRAMUSCULAR

## 2022-05-11 NOTE — Progress Notes (Signed)
After obtaining consent, and per orders of Dr. Ronnald Ramp, injection of B12 given by Marrian Salvage. Patient instructed to report any adverse reaction to me immediately.

## 2022-05-16 DIAGNOSIS — L570 Actinic keratosis: Secondary | ICD-10-CM | POA: Diagnosis not present

## 2022-05-16 DIAGNOSIS — B351 Tinea unguium: Secondary | ICD-10-CM | POA: Diagnosis not present

## 2022-05-16 DIAGNOSIS — L821 Other seborrheic keratosis: Secondary | ICD-10-CM | POA: Diagnosis not present

## 2022-05-16 DIAGNOSIS — L300 Nummular dermatitis: Secondary | ICD-10-CM | POA: Diagnosis not present

## 2022-06-28 ENCOUNTER — Ambulatory Visit
Admission: EM | Admit: 2022-06-28 | Discharge: 2022-06-28 | Disposition: A | Discharge status: Home / Self Care | Payer: Medicare Other | Attending: Internal Medicine | Admitting: Internal Medicine

## 2022-06-28 DIAGNOSIS — S80261A Insect bite (nonvenomous), right knee, initial encounter: Secondary | ICD-10-CM | POA: Diagnosis not present

## 2022-06-28 DIAGNOSIS — W57XXXA Bitten or stung by nonvenomous insect and other nonvenomous arthropods, initial encounter: Secondary | ICD-10-CM

## 2022-06-28 MED ORDER — TRIAMCINOLONE ACETONIDE 0.1 % EX CREA
1.0000 | TOPICAL_CREAM | Freq: Two times a day (BID) | CUTANEOUS | 0 refills | Status: AC
Start: 2022-06-28 — End: ?

## 2022-06-28 MED ORDER — DOXYCYCLINE HYCLATE 100 MG PO CAPS
200.0000 mg | ORAL_CAPSULE | Freq: Once | ORAL | 0 refills | Status: AC
Start: 2022-06-28 — End: 2022-06-28

## 2022-06-28 NOTE — Discharge Instructions (Addendum)
Please apply the cream to the area that is itching Please take the antibiotic as directed If you have worsening symptoms please return to the urgent care to be reevaluated.

## 2022-06-28 NOTE — ED Provider Notes (Signed)
[facility]    CSN: [id number] Arrival date & time: 06/28/22  1625      History   Chief Complaint Chief Complaint  Patient presents with   Tick Bite    HPI Timothy Hudson is a 67 y.o. male to the urgent care for tick bite.  Patient apparently was doing yard work when the bite happened.  He suspects that the bite was on Monday.  Patient sustained that taking the right popliteal fossa.  The tick was removed on Tuesday.  He is developed some redness and itching in the bite area.  It appears that the tick was not engorged.  No knee pain or swelling.  No fever or chills.  Patient endorses itching at the site of the bite and some redness at the site of the bite.  HPI  Past Medical History:  Diagnosis Date   Arthritis    Basal cell carcinoma    basal cell skin cancer forehead, chest ? squamous cell   GERD (gastroesophageal reflux disease)    Pneumonia     Patient Active Problem List   Diagnosis Date Noted   Absolute anemia 10/25/2021   B12 deficiency 10/25/2021   Hyperlipidemia LDL goal <130 10/19/2021   Benign prostatic hyperplasia without lower urinary tract symptoms 10/19/2021   Deficiency anemia 10/19/2021   Need for vaccination 10/19/2021   Primary hypertension 04/09/2020   GERD without esophagitis 04/30/2019   BRBPR (bright red blood per rectum) 04/30/2019   Routine general medical examination at a health care facility 11/25/2014   History of colonic polyps 11/25/2014    Past Surgical History:  Procedure Laterality Date   CARPAL TUNNEL RELEASE Right 08/02/2017   Procedure: RIGHT LIMITED OPEN CARPAL TUNNEL RELEASE;  Surgeon: Roseanne Kaufman, MD;  Location: [facility];  Service: Orthopedics;  Laterality: Right;  60 MINS   COLONOSCOPY     INGUINAL HERNIA REPAIR  1981,2000,2002   right       Home Medications    Prior to Admission medications   Medication Sig Start Date End Date Taking? Authorizing Provider  doxycycline (VIBRAMYCIN) 100 MG capsule Take  2 capsules (200 mg total) by mouth once for 1 dose. 06/28/22 06/28/22 Yes Cameran Pettey, Myrene Galas, MD  triamcinolone cream (KENALOG) 0.1 % Apply 1 Application topically 2 (two) times daily. 06/28/22  Yes Jerra Huckeby, Myrene Galas, MD  acetaminophen (TYLENOL) 500 MG tablet Take 1,000 mg by mouth every 6 (six) hours as needed (for pain/headaches.).    [provider]  esomeprazole (NEXIUM) 40 MG capsule Take 1 capsule (40 mg total) by mouth daily at 12 noon. 10/19/21   Janith Lima, MD  ibuprofen (ADVIL) 200 MG tablet Take 200 mg by mouth every 6 (six) hours as needed.    [provider]  Multiple Vitamin (MULTIVITAMIN WITH MINERALS) TABS tablet Take 1 tablet by mouth daily. CENTRUM SILVER 50+    [provider]    Family History Family History  Problem Relation Age of Onset   COPD Mother    Alcohol abuse Mother    Heart disease Father    Alcohol abuse Father    Early death Brother 58       AIDS   Breast cancer Maternal Grandmother    Lupus Son    Stroke Neg Hx    Asthma Neg Hx    Diabetes Neg Hx    Drug abuse Neg Hx    Hyperlipidemia Neg Hx    Hypertension Neg Hx  Kidney disease Neg Hx    Colon cancer Neg Hx    Rectal cancer Neg Hx    Stomach cancer Neg Hx    Esophageal cancer Neg Hx     Social History Social History   Tobacco Use   Smoking status: Never   Smokeless tobacco: Never  Vaping Use   Vaping Use: Never used  Substance Use Topics   Alcohol use: No   Drug use: No     Allergies   Mobic [meloxicam], Prednisone, and Versed [midazolam]   Review of Systems Review of Systems As per HPI  Physical Exam Triage Vital Signs ED Triage Vitals [06/28/22 1657]  Enc Vitals Group     BP (!) 152/89     Pulse Rate 60     Resp 18     Temp 98.1 F (36.7 C)     Temp Source Oral     SpO2 98 %     Weight      Height      Head Circumference      Peak Flow      Pain Score 0     Pain Loc      Pain Edu?      Excl. in [place]?    No data found.  Updated  Vital Signs BP (!) 152/89 (BP Location: Left Arm)   Pulse 60   Temp 98.1 F (36.7 C) (Oral)   Resp 18   SpO2 98%   Visual Acuity Right Eye Distance:   Left Eye Distance:   Bilateral Distance:    Right Eye Near:   Left Eye Near:    Bilateral Near:     Physical Exam Vitals and nursing note reviewed.  Constitutional:      General: He is not in acute distress.    Appearance: He is not ill-appearing.  Cardiovascular:     Rate and Rhythm: Normal rate and regular rhythm.  Musculoskeletal:        General: Normal range of motion.  Skin:    Comments: Erythematous lesions noted in the popliteal fossa of the right knee.  No discharge.  Erythema is about half an inch in the longest diameter.  It is nontender to touch.  Neurological:     Mental Status: He is alert.      UC Treatments / Results  Labs (all labs ordered are listed, but only abnormal results are displayed) Labs Reviewed - No data to display  EKG   Radiology No results found.  Procedures Procedures (including critical care time)  Medications Ordered in UC Medications - No data to display  Initial Impression / Assessment and Plan / UC Course  I have reviewed the triage vital signs and the nursing notes.  Pertinent labs & imaging results that were available during my care of the patient were reviewed by me and considered in my medical decision making (see chart for details).     1.  Tick bite in the right popliteal fossa: Doxycycline 200 mg x 1 dose Triamcinolone cream as needed for itching and erythema Return precautions given. Final Clinical Impressions(s) / UC Diagnoses   Final diagnoses:  Tick bite of right knee, initial encounter     Discharge Instructions      Please apply the cream to the area that is itching Please take the antibiotic as directed If you have worsening symptoms please return to the urgent care to be reevaluated.   ED Prescriptions     Medication Sig Dispense Auth.  Provider   doxycycline (VIBRAMYCIN) 100 MG capsule Take 2 capsules (200 mg total) by mouth once for 1 dose. 2 capsule Jorrell Kuster, Myrene Galas, MD   triamcinolone cream (KENALOG) 0.1 % Apply 1 Application topically 2 (two) times daily. 30 g Durk Carmen, Myrene Galas, MD      PDMP not reviewed this encounter.   Chase Picket, MD 06/28/22 [phone]

## 2022-06-28 NOTE — ED Triage Notes (Signed)
Pt presents with tick bite behind right knee X 4 days ago.

## 2022-07-10 ENCOUNTER — Ambulatory Visit: Payer: Medicare Other

## 2022-07-17 ENCOUNTER — Ambulatory Visit (INDEPENDENT_AMBULATORY_CARE_PROVIDER_SITE_OTHER): Payer: Medicare Other

## 2022-07-17 DIAGNOSIS — E538 Deficiency of other specified B group vitamins: Secondary | ICD-10-CM

## 2022-07-17 MED ORDER — CYANOCOBALAMIN 1000 MCG/ML IJ SOLN
1000.0000 ug | Freq: Once | INTRAMUSCULAR | Status: AC
  Administered 2022-07-17: 1000 ug via INTRAMUSCULAR

## 2022-07-17 NOTE — Progress Notes (Signed)
After obtaining consent, and per orders of Dr. Jones, injection of B12 given by Jaishon Krisher P Kiriana Worthington. Patient instructed to report any adverse reaction to me immediately.  

## 2022-07-25 ENCOUNTER — Other Ambulatory Visit: Payer: Self-pay | Admitting: Internal Medicine

## 2022-07-25 DIAGNOSIS — K219 Gastro-esophageal reflux disease without esophagitis: Secondary | ICD-10-CM

## 2022-09-18 ENCOUNTER — Ambulatory Visit: Payer: Medicare Other

## 2022-09-20 ENCOUNTER — Ambulatory Visit (INDEPENDENT_AMBULATORY_CARE_PROVIDER_SITE_OTHER): Payer: Medicare Other | Admitting: *Deleted

## 2022-09-20 DIAGNOSIS — E538 Deficiency of other specified B group vitamins: Secondary | ICD-10-CM

## 2022-09-20 MED ORDER — CYANOCOBALAMIN 1000 MCG/ML IJ SOLN
1000.0000 ug | Freq: Once | INTRAMUSCULAR | Status: AC
  Administered 2022-09-20: 1000 ug via INTRAMUSCULAR

## 2022-09-20 NOTE — Progress Notes (Signed)
Pls cosign for B12 inj../lmb  

## 2022-10-25 ENCOUNTER — Encounter (INDEPENDENT_AMBULATORY_CARE_PROVIDER_SITE_OTHER): Payer: Self-pay

## 2022-11-20 ENCOUNTER — Ambulatory Visit (INDEPENDENT_AMBULATORY_CARE_PROVIDER_SITE_OTHER): Payer: Medicare Other

## 2022-11-20 DIAGNOSIS — E538 Deficiency of other specified B group vitamins: Secondary | ICD-10-CM

## 2022-11-20 MED ORDER — CYANOCOBALAMIN 1000 MCG/ML IJ SOLN
1000.0000 ug | Freq: Once | INTRAMUSCULAR | Status: AC
Start: 2022-11-20 — End: 2022-11-20
  Administered 2022-11-20: 1000 ug via INTRAMUSCULAR

## 2022-11-20 NOTE — Progress Notes (Signed)
B12 given.  Pt tolerated well. Pt is aware to give the office a call for an side effects or reactions. Please co-sign.   

## 2022-12-01 ENCOUNTER — Encounter (HOSPITAL_BASED_OUTPATIENT_CLINIC_OR_DEPARTMENT_OTHER): Payer: Self-pay | Admitting: Emergency Medicine

## 2022-12-01 ENCOUNTER — Other Ambulatory Visit: Payer: Self-pay

## 2022-12-01 ENCOUNTER — Emergency Department (HOSPITAL_BASED_OUTPATIENT_CLINIC_OR_DEPARTMENT_OTHER)
Admission: EM | Admit: 2022-12-01 | Discharge: 2022-12-01 | Disposition: A | Discharge status: Home / Self Care | Payer: Medicare Other | Attending: Emergency Medicine | Admitting: Emergency Medicine

## 2022-12-01 ENCOUNTER — Ambulatory Visit
Admission: EM | Admit: 2022-12-01 | Discharge: 2022-12-01 | Disposition: A | Discharge status: Home / Self Care | Payer: Medicare Other

## 2022-12-01 DIAGNOSIS — K625 Hemorrhage of anus and rectum: Secondary | ICD-10-CM

## 2022-12-01 DIAGNOSIS — K921 Melena: Secondary | ICD-10-CM

## 2022-12-01 LAB — CBC
HCT: 41.4 % (ref 39.0–52.0)
Hemoglobin: 14 g/dL (ref 13.0–17.0)
MCH: 30.8 pg (ref 26.0–34.0)
MCHC: 33.8 g/dL (ref 30.0–36.0)
MCV: 91.2 fL (ref 80.0–100.0)
Platelets: 265 10*3/uL (ref 150–400)
RBC: 4.54 MIL/uL (ref 4.22–5.81)
RDW: 13.5 % (ref 11.5–15.5)
WBC: 4.7 10*3/uL (ref 4.0–10.5)
nRBC: 0 % (ref 0.0–0.2)

## 2022-12-01 LAB — COMPREHENSIVE METABOLIC PANEL
ALT: 12 U/L (ref 0–44)
AST: 16 U/L (ref 15–41)
Albumin: 4.1 g/dL (ref 3.5–5.0)
Alkaline Phosphatase: 59 U/L (ref 38–126)
Anion gap: 7 (ref 5–15)
BUN: 15 mg/dL (ref 8–23)
CO2: 27 mmol/L (ref 22–32)
Calcium: 8.9 mg/dL (ref 8.9–10.3)
Chloride: 103 mmol/L (ref 98–111)
Creatinine, Ser: 1.15 mg/dL (ref 0.61–1.24)
GFR, Estimated: >60 mL/min (ref >60)
Glucose, Bld: 90 mg/dL (ref 70–99)
Potassium: 4.2 mmol/L (ref 3.5–5.1)
Sodium: 137 mmol/L (ref 135–145)
Total Bilirubin: 0.4 mg/dL (ref 0.3–1.2)
Total Protein: 6.9 g/dL (ref 6.5–8.1)

## 2022-12-01 NOTE — ED Provider Notes (Signed)
[facility]    CSN: [id number] Arrival date & time: 12/01/22  0948      History   Chief Complaint Chief Complaint  Patient presents with   Rectal Bleeding    HPI Jupiter Reichow is a 67 y.o. male.   Patient presents with concerns of bright red blood in his stool that has been present intermittently for the past 3 months.  Patient reports it has become more prominent over the past few days and is filling the toilet.  States he is having normal bowel movements with no nausea, vomiting, diarrhea.  Reports normal bowel movements with formed stool.  States that he had a colonoscopy in 2020 and was told that he had polyps that were possibly removed. Patient denies any rectal pain or noticing any hemorrhoids.   Rectal Bleeding   Past Medical History:  Diagnosis Date   Arthritis    Basal cell carcinoma    basal cell skin cancer forehead, chest ? squamous cell   GERD (gastroesophageal reflux disease)    Pneumonia     Patient Active Problem List   Diagnosis Date Noted   Absolute anemia 10/25/2021   B12 deficiency 10/25/2021   Hyperlipidemia LDL goal <130 10/19/2021   Benign prostatic hyperplasia without lower urinary tract symptoms 10/19/2021   Deficiency anemia 10/19/2021   Need for vaccination 10/19/2021   Primary hypertension 04/09/2020   GERD without esophagitis 04/30/2019   BRBPR (bright red blood per rectum) 04/30/2019   Routine general medical examination at a health care facility 11/25/2014   History of colonic polyps 11/25/2014    Past Surgical History:  Procedure Laterality Date   CARPAL TUNNEL RELEASE Right 08/02/2017   Procedure: RIGHT LIMITED OPEN CARPAL TUNNEL RELEASE;  Surgeon: Dominica Severin, MD;  Location: [facility];  Service: Orthopedics;  Laterality: Right;  60 MINS   COLONOSCOPY     INGUINAL HERNIA REPAIR  1981,2000,2002   right       Home Medications    Prior to Admission medications   Medication Sig Start Date End Date Taking?  Authorizing Provider  acetaminophen (TYLENOL) 500 MG tablet Take 1,000 mg by mouth every 6 (six) hours as needed (for pain/headaches.).    [provider]  esomeprazole (NEXIUM) 40 MG capsule TAKE 1 CAPSULE (40 MG TOTAL) BY MOUTH DAILY AT 12 NOON. 07/25/22   Etta Grandchild, MD  ibuprofen (ADVIL) 200 MG tablet Take 200 mg by mouth every 6 (six) hours as needed.    [provider]  Multiple Vitamin (MULTIVITAMIN WITH MINERALS) TABS tablet Take 1 tablet by mouth daily. CENTRUM SILVER 50+    [provider]  triamcinolone cream (KENALOG) 0.1 % Apply 1 Application topically 2 (two) times daily. 06/28/22   LampteyBritta Mccreedy, MD    Family History Family History  Problem Relation Age of Onset   COPD Mother    Alcohol abuse Mother    Heart disease Father    Alcohol abuse Father    Early death Brother 33       AIDS   Breast cancer Maternal Grandmother    Lupus Son    Stroke Neg Hx    Asthma Neg Hx    Diabetes Neg Hx    Drug abuse Neg Hx    Hyperlipidemia Neg Hx    Hypertension Neg Hx    Kidney disease Neg Hx    Colon cancer Neg Hx    Rectal cancer Neg Hx    Stomach cancer Neg Hx  Esophageal cancer Neg Hx     Social History Social History   Tobacco Use   Smoking status: Never   Smokeless tobacco: Never  Vaping Use   Vaping status: Never Used  Substance Use Topics   Alcohol use: No   Drug use: No     Allergies   Mobic [meloxicam], Prednisone, and Versed [midazolam]   Review of Systems Review of Systems  Gastrointestinal:  Positive for hematochezia.  Per HPI   Physical Exam Triage Vital Signs ED Triage Vitals [12/01/22 0959]  Encounter Vitals Group     BP (!) 159/89     Systolic BP Percentile      Diastolic BP Percentile      Pulse Rate 63     Resp 16     Temp 97.8 F (36.6 C)     Temp Source Oral     SpO2 97 %     Weight      Height      Head Circumference      Peak Flow      Pain Score 0     Pain Loc      Pain Education       Exclude from Growth Chart    No data found.  Updated Vital Signs BP (!) 159/89 (BP Location: Left Arm)   Pulse 63   Temp 97.8 F (36.6 C) (Oral)   Resp 16   SpO2 97%   Visual Acuity Right Eye Distance:   Left Eye Distance:   Bilateral Distance:    Right Eye Near:   Left Eye Near:    Bilateral Near:     Physical Exam Constitutional:      General: He is not in acute distress.    Appearance: Normal appearance. He is not toxic-appearing or diaphoretic.  HENT:     Head: Normocephalic and atraumatic.  Eyes:     Extraocular Movements: Extraocular movements intact.     Conjunctiva/sclera: Conjunctivae normal.  Pulmonary:     Effort: Pulmonary effort is normal.  Neurological:     General: No focal deficit present.     Mental Status: He is alert and oriented to person, place, and time. Mental status is at baseline.  Psychiatric:        Mood and Affect: Mood normal.        Behavior: Behavior normal.        Thought Content: Thought content normal.        Judgment: Judgment normal.      UC Treatments / Results  Labs (all labs ordered are listed, but only abnormal results are displayed) Labs Reviewed - No data to display  EKG   Radiology No results found.  Procedures Procedures (including critical care time)  Medications Ordered in UC Medications - No data to display  Initial Impression / Assessment and Plan / UC Course  I have reviewed the triage vital signs and the nursing notes.  Pertinent labs & imaging results that were available during my care of the patient were reviewed by me and considered in my medical decision making (see chart for details).     Patient has a photograph of blood in the toilet and it appears to be copious amounts of bright red blood and he reports that it seems to be worsening, therefore, I am concerned that patient needs a stat CBC and more adequate evaluation than can provided here in urgent care.  He was advised to go to the ER and  was  agreeable this plan.  Vital signs stable at discharge.  Agree with patient self transport to the ER. Final Clinical Impressions(s) / UC Diagnoses   Final diagnoses:  Blood in stool   Discharge Instructions   None    ED Prescriptions   None    PDMP not reviewed this encounter.   Gustavus Bryant, [place] 12/01/22 1021

## 2022-12-01 NOTE — Discharge Instructions (Addendum)
The cause of your rectal bleeding is unclear at this time and may be secondary to an internal hemorrhoid, from you sitting for long periods of time, versus diverticulosis.  You are overall well-appearing and your hemoglobin is stable, I recommend you follow-up with the GI doctor, [place], that you previously saw for possible another colonoscopy.  Return to the ER if you have any weakness, shortness of breath, abdominal pain, or dizziness.

## 2022-12-01 NOTE — ED Triage Notes (Signed)
Bright red blood stool x 2, started Monday. Similar episode 3 weeks ago.   Has had polyps removed in past. This is his concern.   Seen at [facility] sent for eval

## 2022-12-01 NOTE — ED Provider Notes (Signed)
[place] EM[facility] DEPARTMENT AT [facility] Provider Note   CSN: [id number] Arrival date & time: 12/01/22  1039     History  Chief Complaint  Patient presents with   Rectal Bleeding    Timothy Hudson is a 67 y.o. male, history of GERD, colon polyps, who presents to the ED secondary to 2 episodes of bright red blood, in the toilet, this week.  He states about 3 and half years ago he was having bright red blood in his stool had a colonoscopy, had a few polyps removed, and was told to come back in 5 years.  He notes that he was doing well, until earlier this year he had 1 episode of bright red blood in the toilet.  States that he did not have it again for several months until this past week.  On Monday he had an episode of bright red blood in the toilet, when trying to have a bowel movement, he states he was not straining, has fairly regular bowel movements.  States that he had another episode today, and is quite a bit of blood in the toilet so he wanted to come to the ER and get evaluated.  Went to urgent care and was told to come to the ER.  Denies any, nausea, vomiting, abdominal pain, weight loss, pencil stools, or constipation.  Is typically having daily bowel movements.  Does sit a lot at his job, denies any frequent squats or heavy lifting.  He is currently not on any blood thinners. Denies any trauma to rectum or insertion of objects in rectum.  Home Medications Prior to Admission medications   Medication Sig Start Date End Date Taking? Authorizing Provider  acetaminophen (TYLENOL) 500 MG tablet Take 1,000 mg by mouth every 6 (six) hours as needed (for pain/headaches.).    [provider]  esomeprazole (NEXIUM) 40 MG capsule TAKE 1 CAPSULE (40 MG TOTAL) BY MOUTH DAILY AT 12 NOON. 07/25/22   Etta Grandchild, MD  ibuprofen (ADVIL) 200 MG tablet Take 200 mg by mouth every 6 (six) hours as needed.    [provider]  Multiple Vitamin (MULTIVITAMIN WITH MINERALS)  TABS tablet Take 1 tablet by mouth daily. CENTRUM SILVER 50+    [provider]  triamcinolone cream (KENALOG) 0.1 % Apply 1 Application topically 2 (two) times daily. 06/28/22   LampteyBritta Mccreedy, MD      Allergies    Mobic [meloxicam], Prednisone, and Versed [midazolam]    Review of Systems   Review of Systems  Gastrointestinal:  Positive for blood in stool. Negative for constipation.    Physical Exam Updated Vital Signs BP (!) 142/85 (BP Location: Right Arm)   Pulse 65   Temp 98.1 F (36.7 C)   Resp 16   SpO2 100%  Physical Exam Vitals and nursing note reviewed.  Constitutional:      General: He is not in acute distress.    Appearance: He is well-developed.  HENT:     Head: Normocephalic and atraumatic.  Eyes:     Conjunctiva/sclera: Conjunctivae normal.  Cardiovascular:     Rate and Rhythm: Normal rate and regular rhythm.     Heart sounds: No murmur heard. Pulmonary:     Effort: Pulmonary effort is normal. No respiratory distress.     Breath sounds: Normal breath sounds.  Abdominal:     Palpations: Abdomen is soft.     Tenderness: There is no abdominal tenderness.  Genitourinary:    Comments: Chaperone  present, old external hemorrhoid at 9:00.  No masses palpated.  No blood or stool in the rectal vault. No ttp or fissures noted.  Musculoskeletal:        General: No swelling.     Cervical back: Neck supple.  Skin:    General: Skin is warm and dry.     Capillary Refill: Capillary refill takes less than 2 seconds.  Neurological:     Mental Status: He is alert.  Psychiatric:        Mood and Affect: Mood normal.     ED Results / Procedures / Treatments   Labs (all labs ordered are listed, but only abnormal results are displayed) Labs Reviewed  COMPREHENSIVE METABOLIC PANEL  CBC    EKG None  Radiology No results found.  Procedures Procedures    Medications Ordered in ED Medications - No data to display  ED Course/ Medical Decision Making/  A&P                                 Medical Decision Making Patient is a 67 year old male, here for painless of bright red blood in the stool.  He has no abdominal pain, nausea, vomiting.  Has happened twice.  This past week.  Previously had a colonoscopy 3-1/2 years ago, and had a few polyps taken out.  Has not any weight loss, or pencillike stools.  Overall well-appearing.  Sent by urgent care, for CBC.  Does not feel lightheaded or dizzy.  Amount and/or Complexity of Data Reviewed Labs: ordered.    Details: Hemoglobin of 14 Discussion of management or test interpretation with external provider(s): Discussed with patient, and Dr. Rush Landmark, patient's hemoglobin is stable, I could not find any kind of stool on his rectal exam, but he did show me pictures with bright red blood.  This possibly diverticulosis versus into more internal hemorrhoid secondary to him sitting for long periods of time.  He has no abdominal pain, weight loss, and has had a recent colonoscopy about 3 and half years ago with polyps removed.  I recommended that he follow-up with his GI doctor, at [facility], for further evaluation and possible another colonoscopy.  He voiced understanding was discharged home.  He is stable at this time, with no complaints    Final Clinical Impression(s) / ED Diagnoses Final diagnoses:  BRBPR (bright red blood per rectum)    Rx / DC Orders ED Discharge Orders     None         Pete Pelt, [place] 12/01/22 1157    Tegeler, Canary Brim, MD 12/01/22 [phone]

## 2022-12-01 NOTE — ED Triage Notes (Signed)
Pt states bright red blood in stool x2 this week.

## 2022-12-01 NOTE — ED Notes (Signed)
Patient is being discharged from the Urgent Care and sent to the Emergency Department via private vehicle . Per [place] PA, patient is in need of higher level of care due to rectal bleeding. Patient is aware and verbalizes understanding of plan of care.  Vitals:   12/01/22 0959  BP: (!) 159/89  Pulse: 63  Resp: 16  Temp: 97.8 F (36.6 C)  SpO2: 97%

## 2022-12-11 ENCOUNTER — Encounter: Payer: Self-pay | Admitting: Internal Medicine

## 2022-12-11 ENCOUNTER — Ambulatory Visit (INDEPENDENT_AMBULATORY_CARE_PROVIDER_SITE_OTHER): Payer: Medicare Other | Admitting: Internal Medicine

## 2022-12-11 VITALS — BP 138/78 | HR 62 | Temp 98.1°F | Resp 16 | Ht 65.0 in | Wt 163.0 lb

## 2022-12-11 DIAGNOSIS — Z0001 Encounter for general adult medical examination with abnormal findings: Secondary | ICD-10-CM

## 2022-12-11 DIAGNOSIS — K648 Other hemorrhoids: Secondary | ICD-10-CM

## 2022-12-11 DIAGNOSIS — E785 Hyperlipidemia, unspecified: Secondary | ICD-10-CM

## 2022-12-11 DIAGNOSIS — Z23 Encounter for immunization: Secondary | ICD-10-CM | POA: Insufficient documentation

## 2022-12-11 DIAGNOSIS — I10 Essential (primary) hypertension: Secondary | ICD-10-CM

## 2022-12-11 DIAGNOSIS — Z0000 Encounter for general adult medical examination without abnormal findings: Secondary | ICD-10-CM

## 2022-12-11 DIAGNOSIS — E538 Deficiency of other specified B group vitamins: Secondary | ICD-10-CM

## 2022-12-11 DIAGNOSIS — N400 Enlarged prostate without lower urinary tract symptoms: Secondary | ICD-10-CM | POA: Diagnosis not present

## 2022-12-11 LAB — LIPID PANEL
Cholesterol: 213 mg/dL — ABNORMAL HIGH (ref 0–200)
HDL: 54.2 mg/dL (ref >39.00)
LDL Cholesterol: 126 mg/dL — ABNORMAL HIGH (ref 0–99)
NonHDL: 158.55
Total CHOL/HDL Ratio: 4
Triglycerides: 165 mg/dL — ABNORMAL HIGH (ref 0.0–149.0)
VLDL: 33 mg/dL (ref 0.0–40.0)

## 2022-12-11 LAB — PSA: PSA: 0.99 ng/mL (ref 0.10–4.00)

## 2022-12-11 LAB — FOLATE: Folate: 12.9 ng/mL (ref >5.9)

## 2022-12-11 NOTE — Progress Notes (Unsigned)
Subjective:  Patient ID: Timothy Hudson, male    DOB: July 14, 1955  Age: 67 y.o. MRN: [id number]  CC: Hypertension, Hyperlipidemia, and Annual Exam   HPI [name] presents for a CPX and f/up ----  Discussed the use of AI scribe software for clinical note transcription with the patient, who gave verbal consent to proceed.  History of Present Illness   The patient, with a history of polyps and potential internal hemorrhoids, presents with intermittent rectal bleeding. The bleeding, described as spotting, was first noticed prior to his last colonoscopy approximately three and a half years ago. At that time, a couple of polyps were found and removed, which may have been the source of the bleeding. The patient reports no associated abdominal or rectal pain, except for occasional discomfort during bowel movements.  Recently, the patient experienced a significant episode of rectal bleeding, prompting a visit to the emergency room. He denies any associated chest pain, shortness of breath, dizziness, or lightheadedness. He also denies any trouble or painful swallowing.  The patient's appetite remains good, with no recent weight loss. He reports nocturia once per night, but denies any other urinary symptoms. The patient maintains an active lifestyle, including walking or jogging three to four times a week, without any associated symptoms.  The patient has a family history of heart disease on his father's side, but denies any personal history of smoking. He reports no known family history of colon or prostate cancer.       Outpatient Medications Prior to Visit  Medication Sig Dispense Refill   acetaminophen (TYLENOL) 500 MG tablet Take 1,000 mg by mouth every 6 (six) hours as needed (for pain/headaches.).     esomeprazole (NEXIUM) 40 MG capsule TAKE 1 CAPSULE (40 MG TOTAL) BY MOUTH DAILY AT 12 NOON. 90 capsule 0   ibuprofen (ADVIL) 200 MG tablet Take 200 mg by mouth every 6 (six) hours  as needed.     Multiple Vitamin (MULTIVITAMIN WITH MINERALS) TABS tablet Take 1 tablet by mouth daily. CENTRUM SILVER 50+     triamcinolone cream (KENALOG) 0.1 % Apply 1 Application topically 2 (two) times daily. 30 g 0   No facility-administered medications prior to visit.    ROS Review of Systems  Objective:  BP 138/78 (BP Location: Right Arm, Patient Position: Sitting, Cuff Size: Normal)   Pulse 62   Temp 98.1 F (36.7 C) (Oral)   Resp 16   Ht 5\' 5"  (1.651 m)   Wt 163 lb (73.9 kg)   SpO2 96%   BMI 27.12 kg/m   BP Readings from Last 3 Encounters:  12/11/22 138/78  12/01/22 (!) 142/85  12/01/22 (!) 159/89    Wt Readings from Last 3 Encounters:  12/11/22 163 lb (73.9 kg)  10/19/21 161 lb (73 kg)  04/19/20 158 lb 9.6 oz (71.9 kg)    Physical Exam Cardiovascular:     Rate and Rhythm: Regular rhythm. Bradycardia present.     Heart sounds: Normal heart sounds, S1 normal and S2 normal. No murmur heard.    No gallop.     Comments: EKG - SB, 59 bpm No LVH, Q waves, or ST/T waves  Abdominal:     Hernia: There is no hernia in the left inguinal area or right inguinal area.  Genitourinary:    Pubic Area: No rash.      Penis: Normal and circumcised.      Testes: Normal.     Epididymis:     Right:  Normal.     Left: Normal.     Prostate: Enlarged. Not tender and no nodules present.     Rectum: Guaiac result positive. Internal hemorrhoid present. No mass, tenderness, anal fissure or external hemorrhoid.  Musculoskeletal:     Right lower leg: No edema.     Left lower leg: No edema.  Lymphadenopathy:     Lower Body: No right inguinal adenopathy. No left inguinal adenopathy.     Lab Results  Component Value Date   WBC 4.7 12/01/2022   HGB 14.0 12/01/2022   HCT 41.4 12/01/2022   PLT 265 12/01/2022   GLUCOSE 90 12/01/2022   CHOL 213 (H) 12/11/2022   TRIG 165.0 (H) 12/11/2022   HDL 54.20 12/11/2022   LDLCALC 126 (H) 12/11/2022   ALT 12 12/01/2022   AST 16  12/01/2022   NA 137 12/01/2022   K 4.2 12/01/2022   CL 103 12/01/2022   CREATININE 1.15 12/01/2022   BUN 15 12/01/2022   CO2 27 12/01/2022   TSH 1.97 10/19/2021   PSA 0.99 12/11/2022    No results found.  Assessment & Plan:  Routine general medical examination at a health care facility  Benign prostatic hyperplasia without lower urinary tract symptoms -     PSA; Future  B12 deficiency -     Folate; Future  Need for vaccination  Hyperlipidemia LDL goal <130 -     Lipid panel; Future -     CT CARDIAC SCORING (SELF PAY ONLY); Future  Flu vaccine need -     Flu Vaccine Trivalent High Dose (Fluad)  Primary hypertension -     EKG 12-Lead -     CT CARDIAC SCORING (SELF PAY ONLY); Future  Hemorrhoids, internal, with bleeding -     Ambulatory referral to Gastroenterology     Follow-up: Return in about 6 months (around 06/10/2023).  Sanda Linger, MD

## 2022-12-11 NOTE — Patient Instructions (Signed)
Health Maintenance, Male Adopting a healthy lifestyle and getting preventive care are important in promoting health and wellness. Ask your health care provider about: The right schedule for you to have regular tests and exams. Things you can do on your own to prevent diseases and keep yourself healthy. What should I know about diet, weight, and exercise? Eat a healthy diet  Eat a diet that includes plenty of vegetables, fruits, low-fat dairy products, and lean protein. Do not eat a lot of foods that are high in solid fats, added sugars, or sodium. Maintain a healthy weight Body mass index (BMI) is a measurement that can be used to identify possible weight problems. It estimates body fat based on height and weight. Your health care provider can help determine your BMI and help you achieve or maintain a healthy weight. Get regular exercise Get regular exercise. This is one of the most important things you can do for your health. Most adults should: Exercise for at least 150 minutes each week. The exercise should increase your heart rate and make you sweat (moderate-intensity exercise). Do strengthening exercises at least twice a week. This is in addition to the moderate-intensity exercise. Spend less time sitting. Even light physical activity can be beneficial. Watch cholesterol and blood lipids Have your blood tested for lipids and cholesterol at 67 years of age, then have this test every 5 years. You may need to have your cholesterol levels checked more often if: Your lipid or cholesterol levels are high. You are older than 68 years of age. You are at high risk for heart disease. What should I know about cancer screening? Many types of cancers can be detected early and may often be prevented. Depending on your health history and family history, you may need to have cancer screening at various ages. This may include screening for: Colorectal cancer. Prostate cancer. Skin cancer. Lung  cancer. What should I know about heart disease, diabetes, and high blood pressure? Blood pressure and heart disease High blood pressure causes heart disease and increases the risk of stroke. This is more likely to develop in people who have high blood pressure readings or are overweight. Talk with your health care provider about your target blood pressure readings. Have your blood pressure checked: Every 3-5 years if you are 44-46 years of age. Every year if you are 71 years old or older. If you are between the ages of 67 and 31 and are a current or former smoker, ask your health care provider if you should have a one-time screening for abdominal aortic aneurysm (AAA). Diabetes Have regular diabetes screenings. This checks your fasting blood sugar level. Have the screening done: Once every three years after age 35 if you are at a normal weight and have a low risk for diabetes. More often and at a younger age if you are overweight or have a high risk for diabetes. What should I know about preventing infection? Hepatitis B If you have a higher risk for hepatitis B, you should be screened for this virus. Talk with your health care provider to find out if you are at risk for hepatitis B infection. Hepatitis C Blood testing is recommended for: Everyone born from [birth year] through 1966. Anyone with known risk factors for hepatitis C. Sexually transmitted infections (STIs) You should be screened each year for STIs, including gonorrhea and chlamydia, if: You are sexually active and are younger than 68 years of age. You are older than 68 years of age and your  health care provider tells you that you are at risk for this type of infection. Your sexual activity has changed since you were last screened, and you are at increased risk for chlamydia or gonorrhea. Ask your health care provider if you are at risk. Ask your health care provider about whether you are at high risk for HIV. Your health care provider  may recommend a prescription medicine to help prevent HIV infection. If you choose to take medicine to prevent HIV, you should first get tested for HIV. You should then be tested every 3 months for as long as you are taking the medicine. Follow these instructions at home: Alcohol use Do not drink alcohol if your health care provider tells you not to drink. If you drink alcohol: Limit how much you have to 0-2 drinks a day. Know how much alcohol is in your drink. In the [place] one drink equals one 12 oz bottle of beer (355 mL), one 5 oz glass of wine (148 mL), or one 1 oz glass of hard liquor (44 mL). Lifestyle Do not use any products that contain nicotine or tobacco. These products include cigarettes, chewing tobacco, and vaping devices, such as e-cigarettes. If you need help quitting, ask your health care provider. Do not use street drugs. Do not share needles. Ask your health care provider for help if you need support or information about quitting drugs. General instructions Schedule regular health, dental, and eye exams. Stay current with your vaccines. Tell your health care provider if: You often feel depressed. You have ever been abused or do not feel safe at home. Summary Adopting a healthy lifestyle and getting preventive care are important in promoting health and wellness. Follow your health care provider's instructions about healthy diet, exercising, and getting tested or screened for diseases. Follow your health care provider's instructions on monitoring your cholesterol and blood pressure. This information is not intended to replace advice given to you by your health care provider. Make sure you discuss any questions you have with your health care provider. Document Revised: 08/02/2020 Document Reviewed: 08/02/2020 Elsevier Patient Education  2024 [employer].

## 2022-12-12 MED ORDER — ROSUVASTATIN CALCIUM 10 MG PO TABS
10.0000 mg | ORAL_TABLET | Freq: Every day | ORAL | 1 refills | Status: DC
Start: 2022-12-12 — End: 2023-06-18

## 2022-12-14 ENCOUNTER — Ambulatory Visit: Payer: Medicare Other | Admitting: Physician Assistant

## 2022-12-14 ENCOUNTER — Encounter: Payer: Self-pay | Admitting: Physician Assistant

## 2022-12-14 VITALS — BP 110/70 | HR 70 | Ht 65.0 in | Wt 163.0 lb

## 2022-12-14 DIAGNOSIS — K648 Other hemorrhoids: Secondary | ICD-10-CM

## 2022-12-14 DIAGNOSIS — Z86010 Personal history of colonic polyps: Secondary | ICD-10-CM | POA: Diagnosis not present

## 2022-12-14 DIAGNOSIS — K625 Hemorrhage of anus and rectum: Secondary | ICD-10-CM

## 2022-12-14 MED ORDER — HYDROCORTISONE ACETATE 25 MG RE SUPP: 25.0000 mg | Freq: Two times a day (BID) | RECTAL | 1 refills | Status: DC | PRN

## 2022-12-14 NOTE — Progress Notes (Signed)
Chief Complaint: Blood in stool  HPI:    Timothy Hudson is a 67 year old male with a past medical history as listed below including reflux, known to Dr. Marina Goodell, who was referred to me by Etta Grandchild, MD for a complaint of blood in stool.      06/24/2019 patient saw Dr. Marina Goodell for colonoscopy done for rectal bleeding, also history of multiple adenomatous polyps with previous exams 2007, 2011 and 2017.  At that time multiple diverticula in the sigmoid colon and internal hemorrhoids.  Repeat recommended in 5 years given a personal history of multiple polyps.    12/01/2022 CBC normal.  Hemoglobin normal at 14.  CMP normal.    12/01/2022 patient seen in the urgent care for bright red blood in his stool intermittently over the past 3 months.  He was sent to the urgent care for stat CBC.    12/01/2022 ER visit for bright red blood in the toilet 2 times that week.  Rectal exam with old external hemorrhoid at 9:00, no blood or stool in the rectal vault.  That time is recommended he follow-up with [place].    Today, patient presents to clinic and tells me about 2 weeks ago now during the week he had 2 episodes of bright red blood in the toilet, he shows me a picture.  This only happened on 2 separate occasions, no rectal pain, no change in bowel habits but he had been doing some yard work and lifting heavy rocks back into place, otherwise no abdominal pain.  He has not had any bleeding now in the past 2 weeks at all and stools are normal.    Denies fever, chills, weight loss, nausea, vomiting or symptoms that awaken him from sleep.  Past Medical History:  Diagnosis Date   Arthritis    Basal cell carcinoma    basal cell skin cancer forehead, chest ? squamous cell   GERD (gastroesophageal reflux disease)    Pneumonia     Past Surgical History:  Procedure Laterality Date   CARPAL TUNNEL RELEASE Right 08/02/2017   Procedure: RIGHT LIMITED OPEN CARPAL TUNNEL RELEASE;  Surgeon: Dominica Severin, MD;  Location: [facility] OR;   Service: Orthopedics;  Laterality: Right;  60 MINS   COLONOSCOPY     INGUINAL HERNIA REPAIR  1981,2000,2002   right    Current Outpatient Medications  Medication Sig Dispense Refill   acetaminophen (TYLENOL) 500 MG tablet Take 1,000 mg by mouth every 6 (six) hours as needed (for pain/headaches.).     esomeprazole (NEXIUM) 40 MG capsule TAKE 1 CAPSULE (40 MG TOTAL) BY MOUTH DAILY AT 12 NOON. 90 capsule 0   ibuprofen (ADVIL) 200 MG tablet Take 200 mg by mouth every 6 (six) hours as needed.     Multiple Vitamin (MULTIVITAMIN WITH MINERALS) TABS tablet Take 1 tablet by mouth daily. CENTRUM SILVER 50+     rosuvastatin (CRESTOR) 10 MG tablet Take 1 tablet (10 mg total) by mouth daily. 90 tablet 1   triamcinolone cream (KENALOG) 0.1 % Apply 1 Application topically 2 (two) times daily. 30 g 0   No current facility-administered medications for this visit.    Allergies as of 12/14/2022 - Review Complete 12/11/2022  Allergen Reaction Noted   Mobic [meloxicam] Other (See Comments) 06/21/2015   Prednisone Other (See Comments) 09/28/2016   Versed [midazolam] Anxiety and Other (See Comments) 11/25/2014    Family History  Problem Relation Age of Onset   COPD Mother    Alcohol abuse  Mother    Heart disease Father    Alcohol abuse Father    Early death Brother 56       AIDS   Breast cancer Maternal Grandmother    Lupus Son    Stroke Neg Hx    Asthma Neg Hx    Diabetes Neg Hx    Drug abuse Neg Hx    Hyperlipidemia Neg Hx    Hypertension Neg Hx    Kidney disease Neg Hx    Colon cancer Neg Hx    Rectal cancer Neg Hx    Stomach cancer Neg Hx    Esophageal cancer Neg Hx     Social History   Socioeconomic History   Marital status: Married    Spouse name: Not on file   Number of children: 2   Years of education: Not on file   Highest education level: Professional school degree (e.g., MD, DDS, DVM, JD)  Occupational History   Occupation: Education officer, environmental  Tobacco Use   Smoking status: Never    Smokeless tobacco: Never  Vaping Use   Vaping status: Never Used  Substance and Sexual Activity   Alcohol use: No   Drug use: No   Sexual activity: Yes    Birth control/protection: None  Other Topics Concern   Not on file  Social History Narrative   Not on file   Social Determinants of Health   Financial Resource Strain: Low Risk  (12/08/2022)   Overall Financial Resource Strain (CARDIA)    Difficulty of Paying Living Expenses: Not hard at all  Food Insecurity: No Food Insecurity (12/08/2022)   Hunger Vital Sign    Worried About Running Out of Food in the Last Year: Never true    Ran Out of Food in the Last Year: Never true  Transportation Needs: No Transportation Needs (12/08/2022)   PRAPARE - Administrator, Civil Service (Medical): No    Lack of Transportation (Non-Medical): No  Physical Activity: Insufficiently Active (12/08/2022)   Exercise Vital Sign    Days of Exercise per Week: 4 days    Minutes of Exercise per Session: 30 min  Stress: No Stress Concern Present (12/08/2022)   [employer] of Occupational Health - Occupational Stress Questionnaire    Feeling of Stress : Not at all  Social Connections: Moderately Integrated (12/08/2022)   Social Connection and Isolation Panel [NHANES]    Frequency of Communication with Friends and Family: Once a week    Frequency of Social Gatherings with Friends and Family: Once a week    Attends Religious Services: More than 4 times per year    Active Member of [employer] or Organizations: Yes    Attends Engineer, structural: More than 4 times per year    Marital Status: Married  Catering manager Violence: Not on file    Review of Systems:    Constitutional: No weight loss, fever or chills Cardiovascular: No chest pain Respiratory: No SOB  Gastrointestinal: See HPI and otherwise negative   Physical Exam:  Vital signs: BP 110/70   Pulse 70   Ht 5\' 5"  (1.651 m)   Wt 163 lb (73.9 kg)   SpO2 99%   BMI 27.12  kg/m    Constitutional:   Pleasant Caucasian male appears to be in NAD, Well developed, Well nourished, alert and cooperative Respiratory: Respirations even and unlabored. Lungs clear to auscultation bilaterally.   No wheezes, crackles, or rhonchi.  Cardiovascular: Normal S1, S2. No MRG. Regular rate and  rhythm. No peripheral edema, cyanosis or pallor.  Gastrointestinal:  Soft, nondistended, nontender. No rebound or guarding. Normal bowel sounds. No appreciable masses or hepatomegaly. Rectal:  Declined Psychiatric: Demonstrates good judgement and reason without abnormal affect or behaviors.  RELEVANT LABS AND IMAGING: CBC    Component Value Date/Time   WBC 4.7 12/01/2022 1106   RBC 4.54 12/01/2022 1106   HGB 14.0 12/01/2022 1106   HCT 41.4 12/01/2022 1106   PLT 265 12/01/2022 1106   MCV 91.2 12/01/2022 1106   MCH 30.8 12/01/2022 1106   MCHC 33.8 12/01/2022 1106   RDW 13.5 12/01/2022 1106   LYMPHSABS 1.5 10/19/2021 1409   MONOABS 0.4 10/19/2021 1409   EOSABS 0.1 10/19/2021 1409   BASOSABS 0.0 10/19/2021 1409    CMP     Component Value Date/Time   NA 137 12/01/2022 1106   K 4.2 12/01/2022 1106   CL 103 12/01/2022 1106   CO2 27 12/01/2022 1106   GLUCOSE 90 12/01/2022 1106   BUN 15 12/01/2022 1106   CREATININE 1.15 12/01/2022 1106   CALCIUM 8.9 12/01/2022 1106   PROT 6.9 12/01/2022 1106   ALBUMIN 4.1 12/01/2022 1106   AST 16 12/01/2022 1106   ALT 12 12/01/2022 1106   ALKPHOS 59 12/01/2022 1106   BILITOT 0.4 12/01/2022 1106   GFRNONAA >60 12/01/2022 1106    Assessment: 1.  Rectal bleeding: On 2 separate occasions about 2 weeks ago, seen in the ER with a normal hemoglobin, no episodes since, up-to-date on colorectal cancer screening with last colonoscopy in 2021 normal other than internal hemorrhoids and repeat recommended in 5 years; likely internal hemorrhoids exacerbated from picking up heavy objects 2.  History of internal hemorrhoids: Likely the source of  above  Plan: 1.  Patient declined rectal exam today.  Discussed that most likely this is his internalnormal hemorrhoids that were bleeding given that he is very up-to-date on his colon cancer screening. 2.  Prescribed Hydrocortisone suppositories twice daily as needed for any continued episodes.  #14 with 1 refill. 3.  Discussed red flags with the patient, if he has any weight loss, change in bowel habits or bleeding that does not stop with suppositories then he should call and let us know.  He verbalized understanding. 4.  Patient to follow in clinic with [place] as needed or for his next colonoscopy.  Hyacinth Meeker, PA-C [facility] 12/14/2022, 2:02 PM  Cc: Etta Grandchild, MD

## 2022-12-14 NOTE — Patient Instructions (Addendum)
_______________________________________________________  If your blood pressure at your visit was 140/90 or greater, please contact your primary care physician to follow up on this. _______________________________________________________  If you are age 68 or older, your body mass index should be between 23-30. Your Body mass index is 27.12 kg/m. If this is out of the aforementioned range listed, please consider follow up with your Primary Care Provider. ________________________________________________________  The [facility] providers would like to encourage you to use [facility] to communicate with providers for non-urgent requests or questions.  Due to long hold times on the telephone, sending your provider a message by [facility] may be a faster and more efficient way to get a response.  Please allow 48 business hours for a response.  Please remember that this is for non-urgent requests.  _______________________________________________________  We have sent the following medications to your pharmacy for you to pick up at your convenience:  Hydrocortisone suppositories twice daily as needed   Thank you for entrusting me with your care and choosing [facility].  Hyacinth Meeker, PA-C

## 2022-12-14 NOTE — Progress Notes (Signed)
Timothy Hudson, Any more bleeding, I would proceed with colonoscopy, just to be sure. Thanks, Dr.  Marina Goodell

## 2022-12-27 ENCOUNTER — Telehealth: Payer: Self-pay

## 2022-12-27 NOTE — Telephone Encounter (Signed)
Transition Care Management Follow-up Telephone Call Date of discharge and from where: 12/01/2022 [facility] How have you been since you were released from the hospital? Patient stated he is feeling much better. Any questions or concerns? No  Items Reviewed: Did the pt receive and understand the discharge instructions provided? Yes  Medications obtained and verified?  No medication prescribed. Other? No  Any new allergies since your discharge? No  Dietary orders reviewed? Yes Do you have support at home? Yes   Follow up appointments reviewed:  PCP Hospital f/u appt confirmed? Yes  Scheduled to see Etta Grandchild, MD on 12/11/2022 @ [facility] [place]. Specialist Hospital f/u appt confirmed? Yes  Scheduled to see Hyacinth Meeker, PA on 12/14/2022 @ [facility]. Are transportation arrangements needed? No  If their condition worsens, is the pt aware to call PCP or go to the Emergency Dept.? Yes Was the patient provided with contact information for the PCP's office or ED? Yes Was to pt encouraged to call back with questions or concerns? Yes  Lujuana Kapler [facility]  [facility], [facility] Guide Direct Dial: [phone]  Website: [email or web address]

## 2022-12-27 NOTE — Telephone Encounter (Signed)
Transition Care Management Unsuccessful Follow-up Telephone Call  Date of discharge and from where:  12/01/2022 [facility]  Attempts:  1st Attempt  Reason for unsuccessful TCM follow-up call:  Left voice message     Jesseca Marsch [facility]  [facility] Guide Direct Dial: [phone]  Website: [email or web address]

## 2022-12-28 ENCOUNTER — Ambulatory Visit
Admission: RE | Admit: 2022-12-28 | Discharge: 2022-12-28 | Disposition: A | Discharge status: Home / Self Care | Payer: No Typology Code available for payment source | Source: Ambulatory Visit | Attending: Internal Medicine | Admitting: Internal Medicine

## 2022-12-28 DIAGNOSIS — I10 Essential (primary) hypertension: Secondary | ICD-10-CM

## 2022-12-28 DIAGNOSIS — E785 Hyperlipidemia, unspecified: Secondary | ICD-10-CM

## 2023-01-01 DIAGNOSIS — M7742 Metatarsalgia, left foot: Secondary | ICD-10-CM | POA: Insufficient documentation

## 2023-01-17 ENCOUNTER — Other Ambulatory Visit: Payer: Self-pay | Admitting: Internal Medicine

## 2023-01-17 DIAGNOSIS — K219 Gastro-esophageal reflux disease without esophagitis: Secondary | ICD-10-CM

## 2023-01-24 ENCOUNTER — Ambulatory Visit: Payer: Medicare Other

## 2023-01-24 ENCOUNTER — Ambulatory Visit (INDEPENDENT_AMBULATORY_CARE_PROVIDER_SITE_OTHER): Payer: Medicare Other

## 2023-01-24 DIAGNOSIS — E538 Deficiency of other specified B group vitamins: Secondary | ICD-10-CM | POA: Diagnosis not present

## 2023-01-24 MED ORDER — CYANOCOBALAMIN 1000 MCG/ML IJ SOLN
1000.0000 ug | Freq: Once | INTRAMUSCULAR | Status: AC
  Administered 2023-01-24: 1000 ug via INTRAMUSCULAR

## 2023-01-24 NOTE — Progress Notes (Signed)
Pt was given B12 injection with no complications.

## 2023-02-13 ENCOUNTER — Ambulatory Visit: Payer: Medicare Other | Admitting: Gastroenterology

## 2023-03-25 ENCOUNTER — Encounter: Payer: Self-pay | Admitting: Pharmacist

## 2023-03-25 NOTE — Progress Notes (Signed)
Pharmacy Quality Measure Review  This patient is appearing on a report for being at risk of failing the adherence measure for cholesterol (statin) medications this calendar year.   Medication: rosuvastatin 10 mg Last fill date: 12/18 for 90 day supply  Insurance report was not up to date. No action needed at this time.   Jarrett Ables, PharmD PGY-1 Pharmacy Resident

## 2023-03-26 ENCOUNTER — Ambulatory Visit (INDEPENDENT_AMBULATORY_CARE_PROVIDER_SITE_OTHER): Payer: Medicare Other

## 2023-03-26 DIAGNOSIS — E538 Deficiency of other specified B group vitamins: Secondary | ICD-10-CM | POA: Diagnosis not present

## 2023-03-26 MED ORDER — CYANOCOBALAMIN 1000 MCG/ML IJ SOLN
1000.0000 ug | Freq: Once | INTRAMUSCULAR | Status: AC
  Administered 2023-03-26: 1000 ug via INTRAMUSCULAR

## 2023-03-26 NOTE — Progress Notes (Signed)
After obtaining consent, and per orders of Dr. Ronnald Ramp, injection of B12 given by Marrian Salvage. Patient instructed to report any adverse reaction to me immediately.

## 2023-04-19 ENCOUNTER — Other Ambulatory Visit: Payer: Self-pay | Admitting: Internal Medicine

## 2023-04-19 ENCOUNTER — Telehealth: Payer: Self-pay | Admitting: Internal Medicine

## 2023-04-19 DIAGNOSIS — G8929 Other chronic pain: Secondary | ICD-10-CM

## 2023-04-19 NOTE — Telephone Encounter (Signed)
Copied from CRM [id number]. Topic: Referral - Request for Referral >> Apr 19, 2023  8:50 AM Adele Barthel wrote: Did the patient discuss referral with their provider in the last year? Yes (If No - schedule appointment) (If Yes - send message)  Appointment offered? Yes  Type of order/referral and detailed reason for visit: Is having issues with left knee, has had pain for 6 weeks. Has tried icing and certain activities aggravate the pain, including cold weather.   Preference of office, provider, location: [facility]-are in network with insurance.  If referral order, have you been seen by this specialty before? Yes (If Yes, this issue or another issue? When? Where?  Can we respond through MyChart? Yes, or at # [phone]

## 2023-04-22 NOTE — Progress Notes (Unsigned)
   Acute Office Visit  Subjective:     Patient ID: Timothy Hudson, male    DOB: 1955-10-02, 68 y.o.   MRN: [id number]  No chief complaint on file.   HPI Patient is in today for ***  ROS Per HPI      Objective:    There were no vitals taken for this visit.   Physical Exam Vitals and nursing note reviewed.  Constitutional:      Appearance: Normal appearance.  HENT:     Head: Normocephalic and atraumatic.  Eyes:     Extraocular Movements: Extraocular movements intact.  Cardiovascular:     Rate and Rhythm: Normal rate and regular rhythm.     Pulses: Normal pulses.     Heart sounds: Normal heart sounds.  Pulmonary:     Effort: Pulmonary effort is normal.     Breath sounds: Normal breath sounds.  Musculoskeletal:        General: Normal range of motion.     Cervical back: Normal range of motion.  Skin:    General: Skin is warm and dry.  Neurological:     General: No focal deficit present.     Mental Status: He is alert and oriented to person, place, and time.  Psychiatric:        Mood and Affect: Mood normal.        Behavior: Behavior normal.   No results found for any visits on 04/23/23.      Assessment & Plan:  ***  No orders of the defined types were placed in this encounter.   No follow-ups on file.  Moshe Cipro, FNP

## 2023-04-23 ENCOUNTER — Ambulatory Visit (INDEPENDENT_AMBULATORY_CARE_PROVIDER_SITE_OTHER): Payer: Medicare Other

## 2023-04-23 ENCOUNTER — Encounter: Payer: Self-pay | Admitting: Family Medicine

## 2023-04-23 ENCOUNTER — Ambulatory Visit (INDEPENDENT_AMBULATORY_CARE_PROVIDER_SITE_OTHER): Payer: Medicare Other | Admitting: Family Medicine

## 2023-04-23 VITALS — BP 130/60 | Ht 65.0 in | Wt 161.2 lb

## 2023-04-23 DIAGNOSIS — M25562 Pain in left knee: Secondary | ICD-10-CM | POA: Diagnosis not present

## 2023-04-23 DIAGNOSIS — M25462 Effusion, left knee: Secondary | ICD-10-CM | POA: Diagnosis not present

## 2023-04-23 NOTE — Patient Instructions (Addendum)
We are getting an xray today. We will be in contact with any abnormal results that require further attention.  I have sent a referral to [facility] for you today as well.

## 2023-04-27 DIAGNOSIS — M25562 Pain in left knee: Secondary | ICD-10-CM | POA: Diagnosis not present

## 2023-05-01 ENCOUNTER — Encounter: Payer: Self-pay | Admitting: Family Medicine

## 2023-05-02 ENCOUNTER — Other Ambulatory Visit: Payer: Self-pay | Admitting: Internal Medicine

## 2023-05-02 DIAGNOSIS — K219 Gastro-esophageal reflux disease without esophagitis: Secondary | ICD-10-CM

## 2023-05-04 DIAGNOSIS — M25562 Pain in left knee: Secondary | ICD-10-CM | POA: Diagnosis not present

## 2023-05-09 DIAGNOSIS — M25562 Pain in left knee: Secondary | ICD-10-CM | POA: Diagnosis not present

## 2023-05-21 DIAGNOSIS — B351 Tinea unguium: Secondary | ICD-10-CM | POA: Diagnosis not present

## 2023-05-21 DIAGNOSIS — L570 Actinic keratosis: Secondary | ICD-10-CM | POA: Diagnosis not present

## 2023-05-21 DIAGNOSIS — L578 Other skin changes due to chronic exposure to nonionizing radiation: Secondary | ICD-10-CM | POA: Diagnosis not present

## 2023-05-21 DIAGNOSIS — L821 Other seborrheic keratosis: Secondary | ICD-10-CM | POA: Diagnosis not present

## 2023-06-01 DIAGNOSIS — M25562 Pain in left knee: Secondary | ICD-10-CM | POA: Diagnosis not present

## 2023-06-16 ENCOUNTER — Encounter: Payer: Self-pay | Admitting: Internal Medicine

## 2023-06-16 ENCOUNTER — Other Ambulatory Visit: Payer: Self-pay | Admitting: Internal Medicine

## 2023-06-16 DIAGNOSIS — E785 Hyperlipidemia, unspecified: Secondary | ICD-10-CM

## 2023-06-18 DIAGNOSIS — K047 Periapical abscess without sinus: Secondary | ICD-10-CM | POA: Diagnosis not present

## 2023-06-26 ENCOUNTER — Ambulatory Visit: Payer: Medicare Other | Admitting: Internal Medicine

## 2023-06-26 ENCOUNTER — Encounter: Payer: Self-pay | Admitting: Internal Medicine

## 2023-06-26 VITALS — BP 136/78 | HR 62 | Temp 97.7°F | Resp 16 | Ht 65.0 in | Wt 159.6 lb

## 2023-06-26 DIAGNOSIS — E785 Hyperlipidemia, unspecified: Secondary | ICD-10-CM | POA: Diagnosis not present

## 2023-06-26 DIAGNOSIS — E538 Deficiency of other specified B group vitamins: Secondary | ICD-10-CM

## 2023-06-26 DIAGNOSIS — N5201 Erectile dysfunction due to arterial insufficiency: Secondary | ICD-10-CM | POA: Diagnosis not present

## 2023-06-26 DIAGNOSIS — I10 Essential (primary) hypertension: Secondary | ICD-10-CM | POA: Diagnosis not present

## 2023-06-26 LAB — TSH: TSH: 2.14 u[IU]/mL (ref 0.35–5.50)

## 2023-06-26 LAB — BASIC METABOLIC PANEL WITH GFR
BUN: 17 mg/dL (ref 6–23)
CO2: 29 meq/L (ref 19–32)
Calcium: 9.2 mg/dL (ref 8.4–10.5)
Chloride: 102 meq/L (ref 96–112)
Creatinine, Ser: 1.07 mg/dL (ref 0.40–1.50)
GFR: 71.38 mL/min (ref >60.00)
Glucose, Bld: 91 mg/dL (ref 70–99)
Potassium: 4.2 meq/L (ref 3.5–5.1)
Sodium: 139 meq/L (ref 135–145)

## 2023-06-26 LAB — CBC WITH DIFFERENTIAL/PLATELET
Basophils Absolute: 0 10*3/uL (ref 0.0–0.1)
Basophils Relative: 0.9 % (ref 0.0–3.0)
Eosinophils Absolute: 0.1 10*3/uL (ref 0.0–0.7)
Eosinophils Relative: 2.5 % (ref 0.0–5.0)
HCT: 39.3 % (ref 39.0–52.0)
Hemoglobin: 13.1 g/dL (ref 13.0–17.0)
Lymphocytes Relative: 31.6 % (ref 12.0–46.0)
Lymphs Abs: 1.3 10*3/uL (ref 0.7–4.0)
MCHC: 33.4 g/dL (ref 30.0–36.0)
MCV: 88.8 fl (ref 78.0–100.0)
Monocytes Absolute: 0.3 10*3/uL (ref 0.1–1.0)
Monocytes Relative: 7.7 % (ref 3.0–12.0)
Neutro Abs: 2.3 10*3/uL (ref 1.4–7.7)
Neutrophils Relative %: 57.3 % (ref 43.0–77.0)
Platelets: 277 10*3/uL (ref 150.0–400.0)
RBC: 4.42 Mil/uL (ref 4.22–5.81)
RDW: 16.4 % — ABNORMAL HIGH (ref 11.5–15.5)
WBC: 4 10*3/uL (ref 4.0–10.5)

## 2023-06-26 NOTE — Patient Instructions (Signed)
 Vitamin B12 Deficiency Vitamin B12 deficiency means that your body does not have enough vitamin B12. The body needs this important vitamin: To make red blood cells. To make genes (DNA). To help the nerves work. If you do not have enough vitamin B12 in your body, you can have health problems, such as not having enough red blood cells in the blood (anemia). What are the causes? Not eating enough foods that contain vitamin B12. Not being able to take in (absorb) vitamin B12 from the food that you eat. Certain diseases. A condition in which the body does not make enough of a certain protein. This results in your body not taking in enough vitamin B12. Having a surgery in which part of the stomach or small intestine is taken out. Taking medicines that make it hard for the body to take in vitamin B12. These include: Heartburn medicines. Some medicines that are used to treat diabetes. What increases the risk? Being an older adult. Eating a vegetarian or vegan diet that does not include any foods that come from animals. Not eating enough foods that contain vitamin B12 while you are pregnant. Taking certain medicines. Having alcoholism. What are the signs or symptoms? In some cases, there are no symptoms. If the condition leads to too few blood cells or nerve damage, symptoms can occur, such as: Feeling weak or tired. Not being hungry. Losing feeling (numbness) or tingling in your hands and feet. Redness and burning of the tongue. Feeling sad (depressed). Confusion or memory problems. Trouble walking. If anemia is very bad, symptoms can include: Being short of breath. Being dizzy. Having a very fast heartbeat. How is this treated? Changing the way you eat and drink, such as: Eating more foods that contain vitamin B12. Drinking little or no alcohol. Getting vitamin B12 shots. Taking vitamin B12 supplements by mouth (orally). Your doctor will tell you the dose that is best for you. Follow  these instructions at home: Eating and drinking  Eat foods that come from animals and have a lot of vitamin B12 in them. These include: Meats and poultry. This includes beef, pork, chicken, [place], and organ meats, such as liver. Seafood, such as clams, rainbow trout, salmon, tuna, and haddock. Eggs. Dairy foods such as milk, yogurt, and cheese. Eat breakfast cereals that have vitamin B12 added to them (are fortified). Check the label. The items listed above may not be a complete list of foods and beverages you can eat and drink. Contact a dietitian for more information. Alcohol use Do not drink alcohol if: Your doctor tells you not to drink. You are pregnant, may be pregnant, or are planning to become pregnant. If you drink alcohol: Limit how much you have to: 0-1 drink a day for women. 0-2 drinks a day for men. Know how much alcohol is in your drink. In the [place] one drink equals one 12 oz bottle of beer (355 mL), one 5 oz glass of wine (148 mL), or one 1 oz glass of hard liquor (44 mL). General instructions Get any vitamin B12 shots if told by your doctor. Take supplements only as told by your doctor. Follow the directions. Keep all follow-up visits. Contact a doctor if: Your symptoms come back. Your symptoms get worse or do not get better with treatment. Get help right away if: You have trouble breathing. You have a very fast heartbeat. You have chest pain. You get dizzy. You faint. These symptoms may be an emergency. Get help right away. Call 911.  Do not wait to see if the symptoms will go away. Do not drive yourself to the hospital. Summary Vitamin B12 deficiency means that your body is not getting enough of the vitamin. In some cases, there are no symptoms of this condition. Treatment may include making a change in the way you eat and drink, getting shots, or taking supplements. Eat foods that have vitamin B12 in them. This information is not intended to replace advice  given to you by your health care provider. Make sure you discuss any questions you have with your health care provider. Document Revised: 11/05/2020 Document Reviewed: 11/05/2020 Elsevier Patient Education  2024 [employer].

## 2023-06-26 NOTE — Progress Notes (Unsigned)
 Subjective:  Patient ID: Timothy Hudson, male    DOB: [birth year]  Age: 68 y.o. MRN: [id number]  CC: Hypertension, Hyperlipidemia, and Gastroesophageal Reflux   HPI Timothy Hudson presents for f/up ----  Discussed the use of AI scribe software for clinical note transcription with the patient, who gave verbal consent to proceed.  History of Present Illness   Timothy Hudson is a 67 year old male who presents for f/up.  He has been experiencing knee pain since late November after stepping in a hole while dragging a deer. He suspects a cartilage tear as he felt something at the time but did not pay attention to it initially. Over time, the pain worsened, especially with walking. He underwent physical therapy at [facility], which did not alleviate the symptoms. An MRI was performed, confirming the need for arthroscopic surgery scheduled for April 10th.  He experiences fatigue, particularly around 2 PM, which he attributes to daily activities. Normal libido but mild erectile dysfunction. He has a history of B12 deficiency anemia and has been receiving B12 injections, with the last dose administered about a month ago. His hemoglobin was 11.9 g/dL in late October, preventing him from donating blood at that time.  He is currently taking fluconazole twice a week for toenail fungus, which has shown improvement in the right toenail. He previously used terbinafine, which was ineffective.       Outpatient Medications Prior to Visit  Medication Sig Dispense Refill   esomeprazole (NEXIUM) 40 MG capsule TAKE 1 CAPSULE (40 MG TOTAL) BY MOUTH DAILY AT 12 NOON. 90 capsule 0   ibuprofen (ADVIL) 200 MG tablet Take 200 mg by mouth every 6 (six) hours as needed.     Multiple Vitamin (MULTIVITAMIN WITH MINERALS) TABS tablet Take 1 tablet by mouth daily. CENTRUM SILVER 50+     rosuvastatin (CRESTOR) 10 MG tablet TAKE 1 TABLET (10 MG TOTAL) BY MOUTH DAILY. 90 tablet 2   triamcinolone cream (KENALOG) 0.1  % Apply 1 Application topically 2 (two) times daily. (Patient taking differently: Apply 1 Application topically as needed.) 30 g 0   acetaminophen (TYLENOL) 500 MG tablet Take 1,000 mg by mouth every 6 (six) hours as needed (for pain/headaches.).     hydrocortisone (ANUSOL-HC) 25 MG suppository Place 1 suppository (25 mg total) rectally 2 (two) times daily as needed for hemorrhoids or anal itching. 14 suppository 1   terbinafine (LAMISIL) 250 MG tablet Take 250 mg by mouth daily.     No facility-administered medications prior to visit.    ROS Review of Systems  Objective:  BP 136/78 (BP Location: Right Leg, Patient Position: Sitting, Cuff Size: Normal)   Pulse 62   Temp 97.7 F (36.5 C) (Oral)   Resp 16   Ht 5\' 5"  (1.651 m)   Wt 159 lb 9.6 oz (72.4 kg)   SpO2 99%   BMI 26.56 kg/m   BP Readings from Last 3 Encounters:  06/26/23 136/78  04/23/23 130/60  12/14/22 110/70    Wt Readings from Last 3 Encounters:  06/26/23 159 lb 9.6 oz (72.4 kg)  04/23/23 161 lb 3.2 oz (73.1 kg)  12/14/22 163 lb (73.9 kg)    Physical Exam  Lab Results  Component Value Date   WBC 4.0 06/26/2023   HGB 13.1 06/26/2023   HCT 39.3 06/26/2023   PLT 277.0 06/26/2023   GLUCOSE 91 06/26/2023   CHOL 213 (H) 12/11/2022   TRIG 165.0 (H) 12/11/2022   HDL 54.20 12/11/2022  LDLCALC 126 (H) 12/11/2022   ALT 12 12/01/2022   AST 16 12/01/2022   NA 139 06/26/2023   K 4.2 06/26/2023   CL 102 06/26/2023   CREATININE 1.07 06/26/2023   BUN 17 06/26/2023   CO2 29 06/26/2023   TSH 2.14 06/26/2023   PSA 0.99 12/11/2022    CT CARDIAC SCORING ([facility] LOCATIONS ONLY) Addendum Date: 12/31/2022 ADDENDUM REPORT: 12/31/2022 12:30 ADDENDUM: In the original report, the wrong numbers were reported for calcium score. Following numbers are correct. Coronary calcium scores: Left main: 0 LAD: 123 LCX: 0 RCA/PDA: 57.8 Total Agatston score: 181 [place] database percentile: 60 IMPRESSION: Total Agatston score: 181 [place]  database percentile: 60 No acute or significant extracardiac abnormality. Electronically Signed   By: Charlett Nose M.D.   On: 12/31/2022 12:30   Result Date: 12/31/2022 CLINICAL DATA:  Hyperlipidemia * Tracking Code: FCC * EXAM: CT CARDIAC CORONARY ARTERY CALCIUM SCORE TECHNIQUE: Non-contrast imaging through the heart was performed using prospective ECG gating. Image post processing was performed on an independent workstation, allowing for quantitative analysis of the heart and coronary arteries. Note that this exam targets the heart and the chest was not imaged in its entirety. COMPARISON:  None available. FINDINGS: CORONARY CALCIUM SCORES: Left Main: 0 LAD: 2 LCx: 0 RCA/PDA: 1 Total Agatston Score: 3 [place]ES[place] database percentile: 21 AORTA MEASUREMENTS: Ascending Aorta: 3.4 cm Descending Aorta:2.5 cm OTHER FINDINGS: Heart is normal size. Aorta normal caliber. No adenopathy. No confluent airspace opacities or effusions. Few scattered calcifications in the descending thoracic aorta. No acute findings in the upper abdomen. Chest wall soft tissues are unremarkable. No acute bony abnormality. IMPRESSION: Total Agatston score: 3 [place] database percentile: 21 No acute or significant extracardiac abnormality. Electronically Signed: By: Charlett Nose M.D. On: 12/31/2022 12:05    Assessment & Plan:  B12 deficiency -     CBC with Differential/Platelet; Future  Hyperlipidemia LDL goal <130 -     TSH; Future  Erectile dysfunction due to arterial insufficiency -     TSH; Future -     Testosterone Total,Free,Bio, Males; Future  Primary hypertension -     Basic metabolic panel with GFR; Future -     TSH; Future     Follow-up: Return in about 6 months (around 12/26/2023).  Sanda Linger, MD

## 2023-06-27 ENCOUNTER — Encounter: Payer: Self-pay | Admitting: Internal Medicine

## 2023-06-27 LAB — TESTOSTERONE TOTAL,FREE,BIO, MALES
Albumin: 4.3 g/dL (ref 3.6–5.1)
Sex Hormone Binding: 37 nmol/L (ref 22–77)
Testosterone, Bioavailable: 143.2 ng/dL (ref 110.0–575.0)
Testosterone, Free: 72.7 pg/mL (ref 46.0–224.0)
Testosterone: 573 ng/dL (ref 250–827)

## 2023-07-05 DIAGNOSIS — M2242 Chondromalacia patellae, left knee: Secondary | ICD-10-CM | POA: Diagnosis not present

## 2023-07-05 DIAGNOSIS — S83242A Other tear of medial meniscus, current injury, left knee, initial encounter: Secondary | ICD-10-CM | POA: Diagnosis not present

## 2023-07-05 DIAGNOSIS — X58XXXA Exposure to other specified factors, initial encounter: Secondary | ICD-10-CM | POA: Diagnosis not present

## 2023-07-05 DIAGNOSIS — Y999 Unspecified external cause status: Secondary | ICD-10-CM | POA: Diagnosis not present

## 2023-07-05 DIAGNOSIS — M6752 Plica syndrome, left knee: Secondary | ICD-10-CM | POA: Diagnosis not present

## 2023-07-05 DIAGNOSIS — S83232A Complex tear of medial meniscus, current injury, left knee, initial encounter: Secondary | ICD-10-CM | POA: Diagnosis not present

## 2023-07-05 DIAGNOSIS — G8918 Other acute postprocedural pain: Secondary | ICD-10-CM | POA: Diagnosis not present

## 2023-07-05 DIAGNOSIS — M2241 Chondromalacia patellae, right knee: Secondary | ICD-10-CM | POA: Diagnosis not present

## 2023-07-05 HISTORY — PX: KNEE SURGERY: SHX244

## 2023-07-11 DIAGNOSIS — M25662 Stiffness of left knee, not elsewhere classified: Secondary | ICD-10-CM | POA: Diagnosis not present

## 2023-07-11 DIAGNOSIS — M25562 Pain in left knee: Secondary | ICD-10-CM | POA: Diagnosis not present

## 2023-07-12 DIAGNOSIS — M25562 Pain in left knee: Secondary | ICD-10-CM | POA: Diagnosis not present

## 2023-07-12 DIAGNOSIS — M25662 Stiffness of left knee, not elsewhere classified: Secondary | ICD-10-CM | POA: Diagnosis not present

## 2023-07-18 DIAGNOSIS — M25662 Stiffness of left knee, not elsewhere classified: Secondary | ICD-10-CM | POA: Diagnosis not present

## 2023-07-18 DIAGNOSIS — M25562 Pain in left knee: Secondary | ICD-10-CM | POA: Diagnosis not present

## 2023-07-20 DIAGNOSIS — M25562 Pain in left knee: Secondary | ICD-10-CM | POA: Diagnosis not present

## 2023-07-20 DIAGNOSIS — M25662 Stiffness of left knee, not elsewhere classified: Secondary | ICD-10-CM | POA: Diagnosis not present

## 2023-07-23 DIAGNOSIS — M25662 Stiffness of left knee, not elsewhere classified: Secondary | ICD-10-CM | POA: Diagnosis not present

## 2023-07-23 DIAGNOSIS — M25562 Pain in left knee: Secondary | ICD-10-CM | POA: Diagnosis not present

## 2023-07-26 DIAGNOSIS — M25562 Pain in left knee: Secondary | ICD-10-CM | POA: Diagnosis not present

## 2023-07-26 DIAGNOSIS — M25662 Stiffness of left knee, not elsewhere classified: Secondary | ICD-10-CM | POA: Diagnosis not present

## 2023-08-06 ENCOUNTER — Telehealth: Payer: Self-pay | Admitting: Internal Medicine

## 2023-08-06 DIAGNOSIS — M25562 Pain in left knee: Secondary | ICD-10-CM | POA: Diagnosis not present

## 2023-08-06 DIAGNOSIS — M25662 Stiffness of left knee, not elsewhere classified: Secondary | ICD-10-CM | POA: Diagnosis not present

## 2023-08-06 NOTE — Telephone Encounter (Signed)
 Copied from CRM [id number]. Topic: General - Other >> Aug 06, 2023  9:12 AM Emylou G wrote: Reason for CRM: Patient needs medical release so he can drive 15 passenger [address] Chi.Aaron Aas He doesn't need CDL.Aaron Aas but this is for insurance purposes.Aaron Aas

## 2023-08-07 ENCOUNTER — Encounter: Payer: Self-pay | Admitting: Internal Medicine

## 2023-08-07 NOTE — Telephone Encounter (Signed)
 Patient wants you to type this letter and sign it.

## 2023-08-14 ENCOUNTER — Other Ambulatory Visit: Payer: Self-pay | Admitting: Internal Medicine

## 2023-08-14 DIAGNOSIS — K219 Gastro-esophageal reflux disease without esophagitis: Secondary | ICD-10-CM

## 2023-09-14 ENCOUNTER — Other Ambulatory Visit: Payer: Self-pay

## 2023-09-18 DIAGNOSIS — H2513 Age-related nuclear cataract, bilateral: Secondary | ICD-10-CM | POA: Diagnosis not present

## 2023-10-01 ENCOUNTER — Ambulatory Visit (INDEPENDENT_AMBULATORY_CARE_PROVIDER_SITE_OTHER)

## 2023-10-01 VITALS — Ht 65.0 in | Wt 159.0 lb

## 2023-10-01 DIAGNOSIS — Z0000 Encounter for general adult medical examination without abnormal findings: Secondary | ICD-10-CM | POA: Diagnosis not present

## 2023-10-01 NOTE — Patient Instructions (Signed)
 Mr. Timothy Hudson , Thank you for taking time out of your busy schedule to complete your Annual Wellness Visit with me. I enjoyed our conversation and look forward to speaking with you again next year. I, as well as your care team,  appreciate your ongoing commitment to your health goals. Please review the following plan we discussed and let me know if I can assist you in the future. Your Game plan/ To Do List    Follow up Visits: Next Medicare AWV with our clinical staff: 10/01/2024.    Have you seen your provider in the last 6 months (3 months if uncontrolled diabetes)? Yes Next Office Visit with your provider: Patient stated that he will call the office to get scheduled for his next office visit.  Clinician Recommendations:  Aim for 30 minutes of exercise or brisk walking, 6-8 glasses of water, and 5 servings of fruits and vegetables each day. Keep up the good work.      This is a list of the screening recommended for you and due dates:  Health Maintenance  Topic Date Due   Medicare Annual Wellness Visit  Never done   COVID-19 Vaccine (2 - 2024-25 season) 11/26/2022   Flu Shot  10/26/2023   Colon Cancer Screening  06/23/2024   DTaP/Tdap/Td vaccine (3 - Td or Tdap) 12/04/2027   Pneumococcal Vaccine for age over 32  Completed   Hepatitis C Screening  Completed   Zoster (Shingles) Vaccine  Completed   Hepatitis B Vaccine  Aged Out   HPV Vaccine  Aged Out   Meningitis B Vaccine  Aged Out    Advanced directives: (In Chart) A copy of your advanced directives are scanned into your chart should your provider ever need it. Advance Care Planning is important because it:  [x]  Makes sure you receive the medical care that is consistent with your values, goals, and preferences  [x]  It provides guidance to your family and loved ones and reduces their decisional burden about whether or not they are making the right decisions based on your wishes.  Follow the link provided in your after visit summary or  read over the paperwork we have mailed to you to help you started getting your Advance Directives in place. If you need assistance in completing these, please reach out to us  so that we can help you!  See attachments for Preventive Care and Fall Prevention Tips.

## 2023-10-01 NOTE — Progress Notes (Signed)
 Subjective:   Timothy Hudson is a 68 y.o. who presents for a Medicare Wellness preventive visit.  As a reminder, Annual Wellness Visits don't include a physical exam, and some assessments may be limited, especially if this visit is performed virtually. We may recommend an in-person follow-up visit with your provider if needed.  Visit Complete: Virtual I connected with  Lynwood Francis Holt on 10/01/23 by a video and audio enabled telemedicine application and verified that I am speaking with the correct person using two identifiers.  Patient Location: Home  Provider Location: Home Office  I discussed the limitations of evaluation and management by telemedicine. The patient expressed understanding and agreed to proceed.  Vital Signs: Because this visit was a virtual/telehealth visit, some criteria may be missing or patient reported. Any vitals not documented were not able to be obtained and vitals that have been documented are patient reported.  Persons Participating in Visit: Patient.  AWV Questionnaire: Yes: Patient Medicare AWV questionnaire was completed by the patient on 09/27/2023; I have confirmed that all information answered by patient is correct and no changes since this date.  Cardiac Risk Factors include: advanced age (>31men, >30 women);hypertension;male gender;Other (see comment), Risk factor comments: BPH     Objective:    Today's Vitals   10/01/23 1049  Weight: 159 lb (72.1 kg)  Height: 5' 5 (1.651 m)   Body mass index is 26.46 kg/m.     10/01/2023   10:59 AM 06/26/2023    1:22 PM 12/01/2022   11:05 AM 07/25/2017    8:18 AM 06/21/2015    1:26 PM  Advanced Directives  Does Patient Have a Medical Advance Directive? Yes Yes No Yes  Yes   Type of Estate agent of [place];Living will Living will;Healthcare Power of [employer] Power of [place];Living will Living will;Healthcare Power of Attorney   Does patient want to make changes to  medical advance directive? No - Patient declined      Copy of Healthcare Power of Attorney in Chart? Yes - validated most recent copy scanned in chart (See row information)   Yes    Would patient like information on creating a medical advance directive?   No - Patient declined       Data saved with a previous flowsheet row definition    Current Medications (verified) Outpatient Encounter Medications as of 10/01/2023  Medication Sig   esomeprazole  (NEXIUM ) 40 MG capsule TAKE 1 CAPSULE (40 MG TOTAL) BY MOUTH DAILY AT 12 NOON.   ibuprofen (ADVIL) 200 MG tablet Take 200 mg by mouth every 6 (six) hours as needed.   Multiple Vitamin (MULTIVITAMIN WITH MINERALS) TABS tablet Take 1 tablet by mouth daily. CENTRUM SILVER 50+   rosuvastatin  (CRESTOR ) 10 MG tablet TAKE 1 TABLET (10 MG TOTAL) BY MOUTH DAILY.   triamcinolone  cream (KENALOG ) 0.1 % Apply 1 Application topically 2 (two) times daily. (Patient taking differently: Apply 1 Application topically as needed.)   No facility-administered encounter medications on file as of 10/01/2023.    Allergies (verified) Mobic [meloxicam], Prednisone, and Versed  [midazolam ]   History: Past Medical History:  Diagnosis Date   Arthritis    Basal cell carcinoma    basal cell skin cancer forehead, chest ? squamous cell   GERD (gastroesophageal reflux disease)    Pneumonia    Past Surgical History:  Procedure Laterality Date   CARPAL TUNNEL RELEASE Right 08/02/2017   Procedure: RIGHT LIMITED OPEN CARPAL TUNNEL RELEASE;  Surgeon: Camella Fallow,  MD;  Location: [facility] OR;  Service: Orthopedics;  Laterality: Right;  60 MINS   COLONOSCOPY     INGUINAL HERNIA REPAIR  1981,2000,2002   right   KNEE SURGERY Left 07/05/2023   Family History  Problem Relation Age of Onset   COPD Mother    Alcohol abuse Mother    Heart disease Father    Alcohol abuse Father    Early death Brother 7       AIDS   Breast cancer Maternal Grandmother    Lupus Son    Stroke Neg Hx     Asthma Neg Hx    Diabetes Neg Hx    Drug abuse Neg Hx    Hyperlipidemia Neg Hx    Hypertension Neg Hx    Kidney disease Neg Hx    Colon cancer Neg Hx    Rectal cancer Neg Hx    Stomach cancer Neg Hx    Esophageal cancer Neg Hx    Social History   Socioeconomic History   Marital status: Married    Spouse name: Mitzie   Number of children: 2   Years of education: Not on file   Highest education level: Master's degree (e.g., MA, MS, MEng, MEd, MSW, MBA)  Occupational History   Occupation: Education officer, environmental    Comment: Part time  Tobacco Use   Smoking status: Never   Smokeless tobacco: Never  Vaping Use   Vaping status: Never Used  Substance and Sexual Activity   Alcohol use: No   Drug use: No   Sexual activity: Yes    Birth control/protection: None  Other Topics Concern   Not on file  Social History Narrative   Lives with wife/2025   Social Drivers of Health   Financial Resource Strain: Low Risk  (09/27/2023)   Overall Financial Resource Strain (CARDIA)    Difficulty of Paying Living Expenses: Not hard at all  Food Insecurity: No Food Insecurity (09/27/2023)   Hunger Vital Sign    Worried About Running Out of Food in the Last Year: Never true    Ran Out of Food in the Last Year: Never true  Transportation Needs: No Transportation Needs (09/27/2023)   PRAPARE - Administrator, Civil Service (Medical): No    Lack of Transportation (Non-Medical): No  Physical Activity: Sufficiently Active (09/27/2023)   Exercise Vital Sign    Days of Exercise per Week: 5 days    Minutes of Exercise per Session: 30 min  Stress: No Stress Concern Present (09/27/2023)   [employer] of Occupational Health - Occupational Stress Questionnaire    Feeling of Stress: Not at all  Social Connections: Socially Integrated (09/27/2023)   Social Connection and Isolation Panel    Frequency of Communication with Friends and Family: Once a week    Frequency of Social Gatherings with Friends and  Family: Twice a week    Attends Religious Services: More than 4 times per year    Active Member of [employer] or Organizations: Yes    Attends Engineer, structural: Not on file    Marital Status: Married    Tobacco Counseling Counseling given: Not Answered    Clinical Intake:  Pre-visit preparation completed: Yes  Pain : No/denies pain     BMI - recorded: 26.46 Nutritional Status: BMI 25 -29 Overweight Nutritional Risks: None Diabetes: No  No results found for: HGBA1C   How often do you need to have someone help you when you read instructions, pamphlets, or other  written materials from your doctor or pharmacy?: 1 - Never  Interpreter Needed?: No  Information entered by :: Justis Closser, RMA   Activities of Daily Living     09/27/2023   12:48 PM 06/26/2023    1:21 PM  In your present state of health, do you have any difficulty performing the following activities:  Hearing? 0 0  Vision? 0 0  Difficulty concentrating or making decisions? 0 0  Walking or climbing stairs? 0 0  Dressing or bathing? 0 0  Doing errands, shopping? 0 0  Preparing Food and eating ? N N  Using the Toilet? N N  In the past six months, have you accidently leaked urine? N N  Do you have problems with loss of bowel control? N N  Managing your Medications? N N  Managing your Finances? N N  Housekeeping or managing your Housekeeping? N N    Patient Care Team: Joshua Debby CROME, MD as PCP - General (Internal Medicine)  I have updated your Care Teams any recent Medical Services you may have received from other providers in the past year.     Assessment:   This is a routine wellness examination for Nesta.  Hearing/Vision screen Hearing Screening - Comments:: Denies hearing difficulties   Vision Screening - Comments:: Wears eyeglasses/ [facility]   Goals Addressed   None    Depression Screen     10/01/2023   11:00 AM 06/26/2023    1:07 PM 12/11/2022   10:07 AM 10/19/2021     1:21 PM 04/30/2019    8:30 AM 02/27/2017    1:26 PM  PHQ 2/9 Scores  PHQ - 2 Score 0 0 0 0 0 0  PHQ- 9 Score 0  1 0      Fall Risk     09/27/2023   12:48 PM 06/26/2023    1:24 PM 06/25/2023    3:42 PM 12/11/2022   10:07 AM 10/19/2021    1:21 PM  Fall Risk   Falls in the past year? 0 1 1 0 0  Number falls in past yr: 0 0 0 0   Injury with Fall? 0 0 0 0   Risk for fall due to :  No Fall Risks  No Fall Risks   Follow up Falls evaluation completed;Falls prevention discussed Falls evaluation completed  Falls evaluation completed     MEDICARE RISK AT HOME:  Medicare Risk at Home Any stairs in or around the home?: (Patient-Rptd) Yes If so, are there any without handrails?: (Patient-Rptd) No Home free of loose throw rugs in walkways, pet beds, electrical cords, etc?: (Patient-Rptd) Yes Adequate lighting in your home to reduce risk of falls?: (Patient-Rptd) Yes Life alert?: (Patient-Rptd) No Grab bars in the bathroom?: (Patient-Rptd) Yes Shower chair or bench in shower?: (Patient-Rptd) No Elevated toilet seat or a handicapped toilet?: (Patient-Rptd) No  TIMED UP AND GO:  Was the test performed?  No  Cognitive Function: Declined/Normal: No cognitive concerns noted by patient or family. Patient alert, oriented, able to answer questions appropriately and recall recent events. No signs of memory loss or confusion.        06/26/2023    1:24 PM  6CIT Screen  What Year? 0 points  What month? 0 points  What time? 0 points  Count back from 20 0 points  Months in reverse 0 points  Repeat phrase 0 points  Total Score 0 points    Immunizations Immunization History  Administered Date(s) Administered  Fluad Trivalent(High Dose 65+) 12/11/2022   Influenza,inj,Quad PF,6+ Mos 01/02/2017, 12/23/2018   Influenza-Unspecified 01/01/2016, 02/25/2020   MMR 12/03/2017   PFIZER Comirnaty(Gray Top)Covid-19 Tri-Sucrose Vaccine 08/03/2020   PNEUMOCOCCAL CONJUGATE-20 10/24/2021   Tdap 09/07/2010,  12/03/2017   Zoster Recombinant(Shingrix ) 04/30/2019, 10/08/2019    Screening Tests Health Maintenance  Topic Date Due   COVID-19 Vaccine (2 - 2024-25 season) 11/26/2022   INFLUENZA VACCINE  10/26/2023   Colonoscopy  06/23/2024   Medicare Annual Wellness (AWV)  09/30/2024   DTaP/Tdap/Td (3 - Td or Tdap) 12/04/2027   Pneumococcal Vaccine: 50+ Years  Completed   Hepatitis C Screening  Completed   Zoster Vaccines- Shingrix   Completed   Hepatitis B Vaccines  Aged Out   HPV VACCINES  Aged Out   Meningococcal B Vaccine  Aged Out    Health Maintenance  Health Maintenance Due  Topic Date Due   COVID-19 Vaccine (2 - 2024-25 season) 11/26/2022   Health Maintenance Items Addressed: See Nurse Notes at the end of this note  Additional Screening:  Vision Screening: Recommended annual ophthalmology exams for early detection of glaucoma and other disorders of the eye. Would you like a referral to an eye doctor? No    Dental Screening: Recommended annual dental exams for proper oral hygiene  Community Resource Referral / Chronic Care Management: CRR required this visit?  No   CCM required this visit?  No   Plan:    I have personally reviewed and noted the following in the patient's chart:   Medical and social history Use of alcohol, tobacco or illicit drugs  Current medications and supplements including opioid prescriptions. Patient is not currently taking opioid prescriptions. Functional ability and status Nutritional status Physical activity Advanced directives List of other physicians Hospitalizations, surgeries, and ER visits in previous 12 months Vitals Screenings to include cognitive, depression, and falls Referrals and appointments  In addition, I have reviewed and discussed with patient certain preventive protocols, quality metrics, and best practice recommendations. A written personalized care plan for preventive services as well as general preventive health  recommendations were provided to patient.   Gertie Broerman L Emmitt Matthews, CMA   10/01/2023   After Visit Summary: (MyChart) Due to this being a telephonic visit, the after visit summary with patients personalized plan was offered to patient via MyChart   Notes: Patient is up to date on all health maintenance with no concerns to address today.

## 2024-03-23 ENCOUNTER — Ambulatory Visit: Admission: EM | Admit: 2024-03-23 | Discharge: 2024-03-23 | Disposition: A | Discharge status: Home / Self Care

## 2024-03-23 DIAGNOSIS — J069 Acute upper respiratory infection, unspecified: Secondary | ICD-10-CM | POA: Diagnosis not present

## 2024-03-23 LAB — POCT INFLUENZA A/B
Influenza A, POC: NEGATIVE
Influenza B, POC: NEGATIVE

## 2024-03-23 LAB — POC SOFIA SARS ANTIGEN FIA: SARS Coronavirus 2 Ag: NEGATIVE

## 2024-03-23 NOTE — ED Triage Notes (Signed)
 Timothy Hudson reports fever (yesterday) cough,congestion, low energy, aches in upper back and shoulders. Sx started Thursday night. Treated with Mucin ex and Night quil.

## 2024-03-23 NOTE — ED Provider Notes (Signed)
 " [facility]    CSN: [id number] Arrival date & time: 03/23/24  9082      History   Chief Complaint No chief complaint on file.   HPI Timothy Hudson is a 68 y.o. male.   Pt presents today due to body aches, cough, fatigue, and congestion that started on Thursday. Pt states that he had a fever yesterday (highest temp was 100.0). Pt states that he has been using Dayquil and Nyquil for symptoms with +/- relief of symptoms. Pt states that he is eating and drinking without issue.   The history is provided by the patient.    Past Medical History:  Diagnosis Date   Arthritis    Basal cell carcinoma    basal cell skin cancer forehead, chest ? squamous cell   GERD (gastroesophageal reflux disease)    Pneumonia     Patient Active Problem List   Diagnosis Date Noted   Flu vaccine need 12/11/2022   B12 deficiency 10/25/2021   Hyperlipidemia LDL goal <130 10/19/2021   Benign prostatic hyperplasia without lower urinary tract symptoms 10/19/2021   Need for vaccination 10/19/2021   Primary hypertension 04/09/2020   GERD without esophagitis 04/30/2019   Encounter for general adult medical examination with abnormal findings 11/25/2014   History of colonic polyps 11/25/2014    Past Surgical History:  Procedure Laterality Date   CARPAL TUNNEL RELEASE Right 08/02/2017   Procedure: RIGHT LIMITED OPEN CARPAL TUNNEL RELEASE;  Surgeon: Camella Fallow, MD;  Location: [facility] OR;  Service: Orthopedics;  Laterality: Right;  60 MINS   COLONOSCOPY     INGUINAL HERNIA REPAIR  1981,2000,2002   right   KNEE SURGERY Left 07/05/2023       Home Medications    Prior to Admission medications  Medication Sig Start Date End Date Taking? Authorizing Provider  esomeprazole  (NEXIUM ) 40 MG capsule TAKE 1 CAPSULE (40 MG TOTAL) BY MOUTH DAILY AT 12 NOON. 08/14/23   Joshua Debby CROME, MD  ibuprofen (ADVIL) 200 MG tablet Take 200 mg by mouth every 6 (six) hours as needed.    [provider]  Multiple Vitamin (MULTIVITAMIN WITH MINERALS) TABS tablet Take 1 tablet by mouth daily. CENTRUM SILVER 50+    [provider]  rosuvastatin  (CRESTOR ) 10 MG tablet TAKE 1 TABLET (10 MG TOTAL) BY MOUTH DAILY. 06/18/23   Joshua Debby CROME, MD  triamcinolone  cream (KENALOG ) 0.1 % Apply 1 Application topically 2 (two) times daily. Patient taking differently: Apply 1 Application topically as needed. 06/28/22   LampteyAleene KIDD, MD    Family History Family History  Problem Relation Age of Onset   COPD Mother    Alcohol abuse Mother    Heart disease Father    Alcohol abuse Father    Early death Brother 24       AIDS   Breast cancer Maternal Grandmother    Lupus Son    Stroke Neg Hx    Asthma Neg Hx    Diabetes Neg Hx    Drug abuse Neg Hx    Hyperlipidemia Neg Hx    Hypertension Neg Hx    Kidney disease Neg Hx    Colon cancer Neg Hx    Rectal cancer Neg Hx    Stomach cancer Neg Hx    Esophageal cancer Neg Hx     Social History Social History[1]   Allergies   Mobic [meloxicam], Prednisone, and Versed  [midazolam ]   Review of Systems Review of Systems  Physical Exam Triage Vital Signs ED Triage Vitals  Encounter Vitals Group     BP 03/23/24 1122 125/79     Girls Systolic BP Percentile --      Girls Diastolic BP Percentile --      Boys Systolic BP Percentile --      Boys Diastolic BP Percentile --      Pulse Rate 03/23/24 1122 82     Resp --      Temp 03/23/24 1122 98.5 F (36.9 C)     Temp Source 03/23/24 1122 Oral     SpO2 03/23/24 1122 100 %     Weight 03/23/24 1120 150 lb (68 kg)     Height 03/23/24 1120 5' 6 (1.676 m)     Head Circumference --      Peak Flow --      Pain Score --      Pain Loc --      Pain Education --      Exclude from Growth Chart --    No data found.  Updated Vital Signs BP 125/79 (BP Location: Left Arm)   Pulse 82   Temp 98.5 F (36.9 C) (Oral)   Ht 5' 6 (1.676 m)   Wt 150 lb (68 kg)   SpO2 100%   BMI  24.21 kg/m   Visual Acuity Right Eye Distance:   Left Eye Distance:   Bilateral Distance:    Right Eye Near:   Left Eye Near:    Bilateral Near:     Physical Exam Vitals and nursing note reviewed.  Constitutional:      General: He is not in acute distress.    Appearance: Normal appearance. He is not ill-appearing, toxic-appearing or diaphoretic.  HENT:     Nose: No congestion or rhinorrhea.     Mouth/Throat:     Mouth: Mucous membranes are moist.     Pharynx: Oropharynx is clear. No oropharyngeal exudate or posterior oropharyngeal erythema.  Eyes:     General: No scleral icterus. Cardiovascular:     Rate and Rhythm: Normal rate and regular rhythm.     Heart sounds: Normal heart sounds.  Pulmonary:     Effort: Pulmonary effort is normal. No respiratory distress.     Breath sounds: Normal breath sounds. No wheezing or rhonchi.  Skin:    General: Skin is warm.  Neurological:     Mental Status: He is alert and oriented to person, place, and time.  Psychiatric:        Mood and Affect: Mood normal.        Behavior: Behavior normal.      UC Treatments / Results  Labs (all labs ordered are listed, but only abnormal results are displayed) Labs Reviewed  POCT INFLUENZA A/B - Normal  POC SOFIA SARS ANTIGEN FIA    EKG   Radiology No results found.  Procedures Procedures (including critical care time)  Medications Ordered in UC Medications - No data to display  Initial Impression / Assessment and Plan / UC Course  I have reviewed the triage vital signs and the nursing notes.  Pertinent labs & imaging results that were available during my care of the patient were reviewed by me and considered in my medical decision making (see chart for details).     Final Clinical Impressions(s) / UC Diagnoses   Final diagnoses:  Viral URI     Discharge Instructions      You been diagnosed with a viral illness today. -  Viruses have to run their course and medicines  that are prescribed are meant to help with symptoms. - With viruses usually feel poorly from 3 to 7 days with cough being the last symptoms to resolve.  -Cough can linger from days to weeks.  Antibiotics are not effective for viruses. -If your cough lasts more than 2 weeks and you are coughing so hard that you are vomiting or feel like you could pass out we need to follow-up with PCP for further testing and evaluation. -Rest, increase water intake, may use pseudoephedrine for nasal congestion, Delsym (dextromethorphan) or honey as needed for cough, and ibuprofen and/or Tylenol  as directed on packaging for pain and fever. -If you have hypertension you should take Coricidin or other OTC meds approved for people with high blood pressure. -You may use a spoonful of honey every 4-6 hours as needed for throat pain and cough. -Warm tea with honey and lemon are helpful for soothe throat as well.  Chloraseptic and Cepacol make a throat lozenge with numbing medication, can be purchased over-the-counter. -May also use Flonase or sinus rinse for sinus pressure or nasal congestion.  Be sure to use distilled bottled water for sinus rinses. -May use coolmist humidifier to open up nasal passages -May elevate head to assist with postnasal drainage. -If you feel poorly (fever, fatigue, shortness of breath, nausea, etc.) for more than 10 days to be sure to follow-up with PCP or in clinic for further evaluation and additional treatments. If you experience chest pain with shortness of breath or pulse oxygen less than 95% you should report to the ER.    ED Prescriptions   None    PDMP not reviewed this encounter.    [1]  Social History Tobacco Use   Smoking status: Never   Smokeless tobacco: Never  Vaping Use   Vaping status: Never Used  Substance Use Topics   Alcohol use: No   Drug use: No     Andra Corean BROCKS, PA-C 03/23/24 1200  "

## 2024-03-23 NOTE — Discharge Instructions (Addendum)

## 2024-03-26 ENCOUNTER — Encounter: Payer: Self-pay | Admitting: Emergency Medicine

## 2024-03-26 ENCOUNTER — Ambulatory Visit
Admission: EM | Admit: 2024-03-26 | Discharge: 2024-03-26 | Disposition: A | Discharge status: Home / Self Care | Source: Home / Self Care

## 2024-03-26 DIAGNOSIS — H1031 Unspecified acute conjunctivitis, right eye: Secondary | ICD-10-CM | POA: Diagnosis not present

## 2024-03-26 MED ORDER — CIPROFLOXACIN HCL 0.3 % OP SOLN: 1.0000 [drp] | OPHTHALMIC | 0 refills | Status: AC

## 2024-03-26 NOTE — ED Provider Notes (Signed)
 " [facility]    CSN: [id number] Arrival date & time: 03/26/24  1645      History   Chief Complaint Chief Complaint  Patient presents with   Eye Drainage    HPI Timothy Hudson is a 68 y.o. male.   Pt presents today due to red right eye with purulent drainage and the sensation of sand in eye that started this am. Pt states that his wife has similar symptoms. Pt denies sensitivity to light or changes in vision.   The history is provided by the patient.    Past Medical History:  Diagnosis Date   Arthritis    Basal cell carcinoma    basal cell skin cancer forehead, chest ? squamous cell   GERD (gastroesophageal reflux disease)    Pneumonia     Patient Active Problem List   Diagnosis Date Noted   Flu vaccine need 12/11/2022   B12 deficiency 10/25/2021   Hyperlipidemia LDL goal <130 10/19/2021   Benign prostatic hyperplasia without lower urinary tract symptoms 10/19/2021   Need for vaccination 10/19/2021   Primary hypertension 04/09/2020   GERD without esophagitis 04/30/2019   Encounter for other orthopedic aftercare 08/10/2017   Encounter for general adult medical examination with abnormal findings 11/25/2014   History of colonic polyps 11/25/2014    Past Surgical History:  Procedure Laterality Date   CARPAL TUNNEL RELEASE Right 08/02/2017   Procedure: RIGHT LIMITED OPEN CARPAL TUNNEL RELEASE;  Surgeon: Camella Fallow, MD;  Location: [facility] OR;  Service: Orthopedics;  Laterality: Right;  60 MINS   COLONOSCOPY     INGUINAL HERNIA REPAIR  1981,2000,2002   right   KNEE SURGERY Left 07/05/2023       Home Medications    Prior to Admission medications  Medication Sig Start Date End Date Taking? Authorizing Provider  ciprofloxacin (CILOXAN) 0.3 % ophthalmic solution Place 1 drop into the right eye every 2 (two) hours for 7 days. Administer 1 drop, every 2 hours, while awake, for 2 days. Then 1 drop, every 4 hours, while awake, for the next 5 days.  03/26/24 04/02/24 Yes Andra Corean BROCKS, PA-C  esomeprazole  (NEXIUM ) 40 MG capsule TAKE 1 CAPSULE (40 MG TOTAL) BY MOUTH DAILY AT 12 NOON. 08/14/23  Yes Joshua Debby CROME, MD  ibuprofen (ADVIL) 200 MG tablet Take 200 mg by mouth every 6 (six) hours as needed.   Yes [provider]  Multiple Vitamin (MULTIVITAMIN WITH MINERALS) TABS tablet Take 1 tablet by mouth daily. CENTRUM SILVER 50+   Yes [provider]  rosuvastatin  (CRESTOR ) 10 MG tablet TAKE 1 TABLET (10 MG TOTAL) BY MOUTH DAILY. 06/18/23  Yes Joshua Debby CROME, MD  triamcinolone  cream (KENALOG ) 0.1 % Apply 1 Application topically 2 (two) times daily. Patient taking differently: Apply 1 Application topically as needed. 06/28/22   LampteyAleene KIDD, MD    Family History Family History  Problem Relation Age of Onset   COPD Mother    Alcohol abuse Mother    Heart disease Father    Alcohol abuse Father    Early death Brother 37       AIDS   Breast cancer Maternal Grandmother    Lupus Son    Stroke Neg Hx    Asthma Neg Hx    Diabetes Neg Hx    Drug abuse Neg Hx    Hyperlipidemia Neg Hx    Hypertension Neg Hx    Kidney disease Neg Hx    Colon cancer Neg  Hx    Rectal cancer Neg Hx    Stomach cancer Neg Hx    Esophageal cancer Neg Hx     Social History Social History[1]   Allergies   Mobic [meloxicam], Prednisone, and Versed  [midazolam ]   Review of Systems Review of Systems   Physical Exam Triage Vital Signs ED Triage Vitals  Encounter Vitals Group     BP 03/26/24 1833 (!) 144/85     Girls Systolic BP Percentile --      Girls Diastolic BP Percentile --      Boys Systolic BP Percentile --      Boys Diastolic BP Percentile --      Pulse Rate 03/26/24 1833 74     Resp 03/26/24 1833 16     Temp 03/26/24 1833 (!) 97.4 F (36.3 C)     Temp Source 03/26/24 1833 Oral     SpO2 03/26/24 1833 96 %     Weight --      Height --      Head Circumference --      Peak Flow --      Pain Score 03/26/24 1831 4      Pain Loc --      Pain Education --      Exclude from Growth Chart --    No data found.  Updated Vital Signs BP (!) 144/85 (BP Location: Left Arm)   Pulse 74   Temp (!) 97.4 F (36.3 C) (Oral)   Resp 16   SpO2 96%   Visual Acuity Right Eye Distance:   Left Eye Distance:   Bilateral Distance:    Right Eye Near:   Left Eye Near:    Bilateral Near:     Physical Exam Vitals and nursing note reviewed.  Constitutional:      General: He is not in acute distress.    Appearance: Normal appearance. He is not ill-appearing, toxic-appearing or diaphoretic.  Eyes:     General: No scleral icterus.       Right eye: Discharge present.     Extraocular Movements: Extraocular movements intact.     Pupils: Pupils are equal, round, and reactive to light.     Comments: Moderate erythema of palpebral conjunctiva   Cardiovascular:     Rate and Rhythm: Normal rate and regular rhythm.     Heart sounds: Normal heart sounds.  Pulmonary:     Effort: Pulmonary effort is normal. No respiratory distress.     Breath sounds: Normal breath sounds. No wheezing or rhonchi.  Skin:    General: Skin is warm.  Neurological:     Mental Status: He is alert and oriented to person, place, and time.  Psychiatric:        Mood and Affect: Mood normal.        Behavior: Behavior normal.      UC Treatments / Results  Labs (all labs ordered are listed, but only abnormal results are displayed) Labs Reviewed - No data to display  EKG   Radiology No results found.  Procedures Procedures (including critical care time)  Medications Ordered in UC Medications - No data to display  Initial Impression / Assessment and Plan / UC Course  I have reviewed the triage vital signs and the nursing notes.  Pertinent labs & imaging results that were available during my care of the patient were reviewed by me and considered in my medical decision making (see chart for details).     Final Clinical  Impressions(s) / UC Diagnoses   Final diagnoses:  Acute bacterial conjunctivitis of right eye     Discharge Instructions      You have been diagnosed with bacterial conjunctivitis or pinkeye today.  You are contagious until you take eyedrops for 24 hours.  Practice good hand hygiene, use soap and water.  Be sure not to share towels and washcloths, change them daily.  Symptoms should improve in 3 days, but use eyedrops for 7.  If you experience changes in vision you need to follow-up with ophthalmologist as soon as possible.    ED Prescriptions     Medication Sig Dispense Auth. Provider   ciprofloxacin (CILOXAN) 0.3 % ophthalmic solution Place 1 drop into the right eye every 2 (two) hours for 7 days. Administer 1 drop, every 2 hours, while awake, for 2 days. Then 1 drop, every 4 hours, while awake, for the next 5 days. 4.2 mL Andra Corean BROCKS, PA-C      PDMP not reviewed this encounter.    [1]  Social History Tobacco Use   Smoking status: Never   Smokeless tobacco: Never  Vaping Use   Vaping status: Never Used  Substance Use Topics   Alcohol use: No   Drug use: No     Andra Corean BROCKS, PA-C 03/26/24 1854  "

## 2024-03-26 NOTE — Discharge Instructions (Signed)
 You have been diagnosed with bacterial conjunctivitis or pinkeye today.  You are contagious until you take eyedrops for 24 hours.  Practice good hand hygiene, use soap and water .  Be sure not to share towels and washcloths, change them daily.  Symptoms should improve in 3 days, but use eyedrops for 7.  If you experience changes in vision you need to follow-up with ophthalmologist as soon as possible.

## 2024-03-26 NOTE — ED Triage Notes (Signed)
 Pt reports R eye redness with yellow drainage from eye that started this morning. No sick contacts, but reports his wife is now having similar issues with her eye this afternoon. Applied a stye cream to the eye today with no relief. Denies blurred vision or vision changes.

## 2024-04-01 ENCOUNTER — Inpatient Hospital Stay: Admitting: Internal Medicine

## 2024-04-04 ENCOUNTER — Other Ambulatory Visit: Payer: Self-pay | Admitting: Internal Medicine

## 2024-04-04 DIAGNOSIS — K219 Gastro-esophageal reflux disease without esophagitis: Secondary | ICD-10-CM

## 2024-04-08 ENCOUNTER — Other Ambulatory Visit: Payer: Self-pay | Admitting: Internal Medicine

## 2024-04-08 ENCOUNTER — Inpatient Hospital Stay: Admitting: Internal Medicine

## 2024-04-08 DIAGNOSIS — E785 Hyperlipidemia, unspecified: Secondary | ICD-10-CM

## 2024-06-04 ENCOUNTER — Encounter: Admitting: Internal Medicine

## 2024-10-01 ENCOUNTER — Ambulatory Visit
